# Patient Record
Sex: Female | Born: 1990 | Race: White | Hispanic: No | Marital: Single | Smoking: Never smoker
Health system: Southern US, Community
[De-identification: ages and names within clinical notes are randomized; demographics above are authoritative.]

## PROBLEM LIST (undated history)

## (undated) DIAGNOSIS — N200 Calculus of kidney: Secondary | ICD-10-CM

## (undated) DIAGNOSIS — J45909 Unspecified asthma, uncomplicated: Secondary | ICD-10-CM

## (undated) DIAGNOSIS — F422 Mixed obsessional thoughts and acts: Secondary | ICD-10-CM

## (undated) DIAGNOSIS — F329 Major depressive disorder, single episode, unspecified: Secondary | ICD-10-CM

## (undated) DIAGNOSIS — F411 Generalized anxiety disorder: Secondary | ICD-10-CM

## (undated) DIAGNOSIS — F429 Obsessive-compulsive disorder, unspecified: Secondary | ICD-10-CM

## (undated) HISTORY — PX: ARTHROSCOPIC REPAIR ACL: SUR80

---

## 2016-01-24 ENCOUNTER — Encounter: Payer: Self-pay | Admitting: *Deleted

## 2016-01-24 ENCOUNTER — Emergency Department
Admission: EM | Admit: 2016-01-24 | Discharge: 2016-01-24 | Disposition: A | Discharge status: Home / Self Care | Payer: BLUE CROSS/BLUE SHIELD | Source: Home / Self Care | Attending: Family Medicine | Admitting: Family Medicine

## 2016-01-24 DIAGNOSIS — B9789 Other viral agents as the cause of diseases classified elsewhere: Secondary | ICD-10-CM

## 2016-01-24 DIAGNOSIS — J069 Acute upper respiratory infection, unspecified: Secondary | ICD-10-CM | POA: Diagnosis not present

## 2016-01-24 HISTORY — DX: Unspecified asthma, uncomplicated: J45.909

## 2016-01-24 MED ORDER — AZITHROMYCIN 250 MG PO TABS: ORAL_TABLET | ORAL | 0 refills | Status: DC

## 2016-01-24 MED ORDER — PREDNISONE 20 MG PO TABS: ORAL_TABLET | ORAL | 0 refills | Status: DC

## 2016-01-24 NOTE — ED Provider Notes (Signed)
[facility]    CSN: [id number] Arrival date & time: 01/24/16  1551     History   Chief Complaint Chief Complaint  Patient presents with  . Facial Pain  . Otalgia  . Nasal Congestion    HPI Erika Hernandez is a 25 y.o. female.   Patient complains of sinus congestion, cough, and sore throat for about a month but has not felt ill.  During the past week she has developed increased sinus pressure and cough.  Last night she developed a mild sore throat, fatigue, and sensation of left ear being clogged. She had pneumonia in February this year.  She has a history of seasonal rhinitis and asthma.  She has a family history of asthma (brother).   The history is provided by the patient.    Past Medical History:  Diagnosis Date  . Asthma     There are no active problems to display for this patient.   Past Surgical History:  Procedure Laterality Date  . ARTHROSCOPIC REPAIR ACL      OB History    No data available       Home Medications    Prior to Admission medications   Medication Sig Start Date End Date Taking? Authorizing Provider  azithromycin (ZITHROMAX Z-PAK) 250 MG tablet Take 2 tabs today; then begin one tab once daily for 4 more days. (Rx void after 02/02/16) 01/24/16   Lattie HawStephen A Jung Ingerson, MD  predniSONE (DELTASONE) 20 MG tablet Take one tab by mouth twice daily for 5 days, then one daily. Take with food. 01/24/16   Lattie HawStephen A Donzella Carrol, MD    Family History History reviewed. No pertinent family history.  Social History Social History  Substance Use Topics  . Smoking status: Never Smoker  . Smokeless tobacco: Never Used  . Alcohol use Yes     Allergies   Patient has no known allergies.   Review of Systems Review of Systems  + sore throat + cough No pleuritic pain No wheezing + nasal congestion + post-nasal drainage + sinus pain/pressure No itchy/red eyes ? earache No hemoptysis No SOB No fever/chills No nausea No vomiting No  abdominal pain No diarrhea No urinary symptoms No skin rash + fatigue No myalgias No headache Used OTC meds without relief    Physical Exam Triage Vital Signs ED Triage Vitals  Enc Vitals Group     BP 01/24/16 1614 121/83     Pulse Rate 01/24/16 1614 90     Resp 01/24/16 1614 14     Temp 01/24/16 1614 98.3 F (36.8 C)     Temp Source 01/24/16 1614 Oral     SpO2 01/24/16 1614 99 %     Weight 01/24/16 1614 192 lb (87.1 kg)     Height --      Head Circumference --      Peak Flow --      Pain Score 01/24/16 1617 4     Pain Loc --      Pain Edu? --      Excl. in GC? --    No data found.   Updated Vital Signs BP 121/83 (BP Location: Left Arm)   Pulse 90   Temp 98.3 F (36.8 C) (Oral)   Resp 14   Wt 192 lb (87.1 kg)   LMP 01/07/2016   SpO2 99%   Visual Acuity Right Eye Distance:   Left Eye Distance:   Bilateral Distance:    Right Eye Near:  Left Eye Near:    Bilateral Near:     Physical Exam Nursing notes and Vital Signs reviewed. Appearance:  Patient appears stated age, and in no acute distress Eyes:  Pupils are equal, round, and reactive to light and accomodation.  Extraocular movement is intact.  Conjunctivae are not inflamed  Ears:  Canals normal.  Tympanic membranes normal.  Nose:  Mildly congested turbinates.  No sinus tenderness.  Pharynx:  Normal Neck:  Supple.  Tender enlarged posterior/lateral nodes are palpated bilaterally  Lungs:  Clear to auscultation.  Breath sounds are equal.  Moving air well. Heart:  Regular rate and rhythm without murmurs, rubs, or gallops.  Abdomen:  Nontender without masses or hepatosplenomegaly.  Bowel sounds are present.  No CVA or flank tenderness.  Extremities:  No edema.  Skin:  No rash present.    UC Treatments / Results  Labs (all labs ordered are listed, but only abnormal results are displayed) Labs Reviewed - No data to display  EKG  EKG Interpretation None       Radiology No results  found.  Procedures Procedures (including critical care time)  Medications Ordered in UC Medications - No data to display   Initial Impression / Assessment and Plan / UC Course  I have reviewed the triage vital signs and the nursing notes.  Pertinent labs & imaging results that were available during my care of the patient were reviewed by me and considered in my medical decision making (see chart for details).  Clinical Course   Reassurance:  there is no evidence of bacterial infection today.   Wither her history of asthma and seasonal rhinitis, begin prednisone burst/taper.  Take plain guaifenesin (1200mg  extended release tabs such as Mucinex) twice daily, with plenty of water, for cough and congestion.  May add Pseudoephedrine (30mg , one or two every 4 to 6 hours) for sinus congestion.  Get adequate rest.   May use Afrin nasal spray (or generic oxymetazoline) twice daily for about 5 days and then discontinue.  Also recommend using saline nasal spray several times daily and saline nasal irrigation (AYR is a common brand).  Use Flonase nasal spray each morning after using Afrin nasal spray and saline nasal irrigation. Try warm salt water gargles for sore throat.  Stop all antihistamines for now, and other non-prescription cough/cold preparations. May take Delsym Cough Suppressant at bedtime for nighttime cough.  Use albuterol inhaler as needed for shortness of breath or wheezing. Begin Azithromycin if not improving about one week or if persistent fever develops (Given a prescription to hold, with an expiration date)  Follow-up with family doctor if not improving about10 days.     Final Clinical Impressions(s) / UC Diagnoses   Final diagnoses:  Viral URI with cough    New Prescriptions New Prescriptions   AZITHROMYCIN (ZITHROMAX Z-PAK) 250 MG TABLET    Take 2 tabs today; then begin one tab once daily for 4 more days. (Rx void after 02/02/16)   PREDNISONE (DELTASONE) 20 MG TABLET     Take one tab by mouth twice daily for 5 days, then one daily. Take with food.     Lattie HawStephen A Ymani Porcher, MD 02/06/16 [phone]

## 2016-01-24 NOTE — ED Triage Notes (Signed)
Patient c/o 1 month of nasal congestion, cough and sore throat. Yesterday developed left facial pain and ear pain. Taken generic sinus med otc. Afebrile.

## 2016-01-24 NOTE — Discharge Instructions (Signed)
Take plain guaifenesin (1200mg  extended release tabs such as Mucinex) twice daily, with plenty of water, for cough and congestion.  May add Pseudoephedrine (30mg , one or two every 4 to 6 hours) for sinus congestion.  Get adequate rest.   May use Afrin nasal spray (or generic oxymetazoline) twice daily for about 5 days and then discontinue.  Also recommend using saline nasal spray several times daily and saline nasal irrigation (AYR is a common brand).  Use Flonase nasal spray each morning after using Afrin nasal spray and saline nasal irrigation. Try warm salt water gargles for sore throat.  Stop all antihistamines for now, and other non-prescription cough/cold preparations. May take Delsym Cough Suppressant at bedtime for nighttime cough.  Use albuterol inhaler as needed for shortness of breath or wheezing. Begin Azithromycin if not improving about one week or if persistent fever develops   Follow-up with family doctor if not improving about10 days.

## 2016-05-01 ENCOUNTER — Encounter: Payer: Self-pay | Admitting: *Deleted

## 2016-05-01 ENCOUNTER — Emergency Department
Admission: EM | Admit: 2016-05-01 | Discharge: 2016-05-01 | Disposition: A | Discharge status: Home / Self Care | Payer: BLUE CROSS/BLUE SHIELD | Source: Home / Self Care | Attending: Family Medicine | Admitting: Family Medicine

## 2016-05-01 ENCOUNTER — Emergency Department (INDEPENDENT_AMBULATORY_CARE_PROVIDER_SITE_OTHER): Payer: BLUE CROSS/BLUE SHIELD

## 2016-05-01 DIAGNOSIS — N132 Hydronephrosis with renal and ureteral calculous obstruction: Secondary | ICD-10-CM

## 2016-05-01 DIAGNOSIS — R109 Unspecified abdominal pain: Secondary | ICD-10-CM | POA: Diagnosis not present

## 2016-05-01 DIAGNOSIS — N200 Calculus of kidney: Secondary | ICD-10-CM

## 2016-05-01 LAB — POCT URINALYSIS DIP (MANUAL ENTRY)
Bilirubin, UA: NEGATIVE
Glucose, UA: NEGATIVE
Ketones, POC UA: NEGATIVE
Leukocytes, UA: NEGATIVE
Nitrite, UA: NEGATIVE
PROTEIN UA: NEGATIVE
SPEC GRAV UA: 1.025 (ref 1.030–1.035)
UROBILINOGEN UA: 0.2 (ref ?–2.0)
pH, UA: 5.5 (ref 5.0–8.0)

## 2016-05-01 LAB — POCT URINE PREGNANCY: PREG TEST UR: NEGATIVE

## 2016-05-01 MED ORDER — HYDROCODONE-ACETAMINOPHEN 5-325 MG PO TABS: 1.0000 | ORAL_TABLET | Freq: Four times a day (QID) | ORAL | 0 refills | Status: DC | PRN

## 2016-05-01 MED ORDER — ONDANSETRON 4 MG PO TBDP: ORAL_TABLET | ORAL | 0 refills | Status: DC

## 2016-05-01 MED ORDER — TAMSULOSIN HCL 0.4 MG PO CAPS: 0.4000 mg | ORAL_CAPSULE | Freq: Every day | ORAL | 0 refills | Status: DC

## 2016-05-01 MED ORDER — KETOROLAC TROMETHAMINE 60 MG/2ML IJ SOLN
60.0000 mg | Freq: Once | INTRAMUSCULAR | Status: AC
  Administered 2016-05-01: 60 mg via INTRAMUSCULAR

## 2016-05-01 MED ORDER — ONDANSETRON 4 MG PO TBDP
4.0000 mg | ORAL_TABLET | Freq: Once | ORAL | Status: AC
  Administered 2016-05-01: 4 mg via ORAL

## 2016-05-01 NOTE — ED Provider Notes (Signed)
[facility]    CSN: [id number] Arrival date & time: 05/01/16  [id number]     History   Chief Complaint Chief Complaint  Patient presents with  . Flank Pain    HPI Erika Hernandez is a 26 y.o. female.   Three days ago patient developed right lower back pain that lasted about 30 minutes.  She had nausea without vomiting.  The pain had resolved by the next day, but recurred last night at 7:30pm.  She has had sweats without fever.  The pain is now constant.  She has had mild dysuria and urgency. She has a family history of kidney stones in her paternal uncle and father.     Patient's last menstrual period was 04/15/2016.    The history is provided by the patient.  Flank Pain  This is a new problem. Episode onset: 3 days ago. The problem occurs constantly. The problem has been rapidly worsening. Pertinent negatives include no abdominal pain. Nothing aggravates the symptoms. Nothing relieves the symptoms. Treatments tried: Ibuprofen.    Past Medical History:  Diagnosis Date  . Asthma     There are no active problems to display for this patient.   Past Surgical History:  Procedure Laterality Date  . ARTHROSCOPIC REPAIR ACL      OB History    No data available       Home Medications    Prior to Admission medications   Medication Sig Start Date End Date Taking? Authorizing Provider  HYDROcodone-acetaminophen (NORCO/VICODIN) 5-325 MG tablet Take 1 tablet by mouth every 6 (six) hours as needed for moderate pain. 05/01/16   Lattie HawStephen A Beese, MD  ondansetron (ZOFRAN ODT) 4 MG disintegrating tablet Take one tab by mouth Q6hr prn nausea.  Dissolve under tongue. 05/01/16   Lattie HawStephen A Beese, MD  tamsulosin (FLOMAX) 0.4 MG CAPS capsule Take 1 capsule (0.4 mg total) by mouth daily after supper. 05/01/16   Lattie HawStephen A Beese, MD    Family History Family History  Problem Relation Age of Onset  . Kidney Stones Father     Social History Social History  Substance Use Topics    . Smoking status: Never Smoker  . Smokeless tobacco: Never Used  . Alcohol use Yes     Comment: 6-7 q wk     Allergies   Patient has no known allergies.   Review of Systems Review of Systems  Constitutional: Positive for appetite change and chills. Negative for fever.  Gastrointestinal: Positive for nausea. Negative for abdominal pain and vomiting.  Genitourinary: Positive for dysuria, flank pain, frequency and urgency. Negative for difficulty urinating.  All other systems reviewed and are negative.    Physical Exam Triage Vital Signs ED Triage Vitals  Enc Vitals Group     BP 05/01/16 0912 (!) 143/98     Pulse Rate 05/01/16 0912 67     Resp 05/01/16 0912 18     Temp 05/01/16 0912 97.6 F (36.4 C)     Temp Source 05/01/16 0912 Oral     SpO2 05/01/16 0912 99 %     Weight 05/01/16 0913 191 lb (86.6 kg)     Height 05/01/16 0913 5\' 8"  (1.727 m)     Head Circumference --      Peak Flow --      Pain Score 05/01/16 0923 10     Pain Loc --      Pain Edu? --      Excl. in GC? --  No data found.   Updated Vital Signs BP (!) 143/98 (BP Location: Left Arm)   Pulse 67   Temp 97.6 F (36.4 C) (Oral)   Resp 18   Ht 5\' 8"  (1.727 m)   Wt 191 lb (86.6 kg)   LMP 04/15/2016   SpO2 99%   BMI 29.04 kg/m   Visual Acuity Right Eye Distance:   Left Eye Distance:   Bilateral Distance:    Right Eye Near:   Left Eye Near:    Bilateral Near:     Physical Exam Nursing notes and Vital Signs reviewed. Appearance:  Patient appears stated age, and in no acute distress.    Eyes:  Pupils are equal, round, and reactive to light and accomodation.  Extraocular movement is intact.  Conjunctivae are not inflamed   Pharynx:  Normal; moist mucous membranes  Neck:  Supple.  No adenopathy Lungs:  Clear to auscultation.  Breath sounds are equal.  Moving air well. Heart:  Regular rate and rhythm without murmurs, rubs, or gallops.  Abdomen:  Nontender without masses or hepatosplenomegaly.   Bowel sounds are present.  Mild right flank tenderness.  Extremities:  No edema.  Skin:  No rash present.     UC Treatments / Results  Labs (all labs ordered are listed, but only abnormal results are displayed) Labs Reviewed  POCT URINALYSIS DIP (MANUAL ENTRY) - Abnormal; Notable for the following:       Result Value   Blood, UA moderate (*)    All other components within normal limits  POCT URINE PREGNANCY negative    EKG  EKG Interpretation None       Radiology Ct Renal Stone Study  Result Date: 05/01/2016 CLINICAL DATA:  Right flank pain for several days. Nausea and vomiting EXAM: CT ABDOMEN AND PELVIS WITHOUT CONTRAST TECHNIQUE: Multidetector CT imaging of the abdomen and pelvis was performed following the standard protocol without oral or intravenous contrast material administration. COMPARISON:  None. FINDINGS: Lower chest: Lung bases are clear. Hepatobiliary: There is a tiny apparent cyst in the dome of the liver on the right posteriorly. No other focal liver lesions are evident on this noncontrast enhanced study. Gallbladder wall is not appreciably thickened. There is no biliary duct dilatation. Pancreas: There is no pancreatic mass or inflammatory focus. Spleen: No splenic lesions are evident. Adrenals/Urinary Tract: Adrenals appear normal bilaterally. Right kidney appears mildly edematous. There is no renal mass on either side. There is moderately severe hydronephrosis on the right. There is no hydronephrosis on the left. There is no intrarenal calculus on either side. There is a 2 mm calculus in the distal right ureter near the ureterovesical junction. No other ureteral calculi are evident on either side. Urinary bladder is midline with wall thickness within normal limits. Stomach/Bowel: There is no appreciable bowel wall or mesenteric thickening. There is no bowel obstruction. No free air or portal venous air. Vascular/Lymphatic: There is no abdominal aortic aneurysm. No  vascular lesions are evident on this noncontrast enhanced study. There is no adenopathy in the abdomen or pelvis. Reproductive: Uterus is retroverted. No pelvic mass. There is minimal free fluid in the cul-de-sac. Other: Appendix region appears normal without inflammation. No abscess evident in the abdomen or pelvis. Musculoskeletal: There are no blastic or lytic bone lesions. There is no intramuscular or abdominal wall lesion. IMPRESSION: 2 mm calculus distal right ureter with moderately severe hydronephrosis on the right. Right kidney is mildly edematous. Minimal fluid in the cul-de-sac which may be  physiologic or may be reactive secondary to the distal ureteral calculus on the right. No bowel obstruction.  No abscess.  Appendix region appears normal. These results will be called to the ordering clinician or representative by the Radiologist Assistant, and communication documented in the PACS or zVision Dashboard. Electronically Signed   By: Bretta Bang III M.D.   On: 05/01/2016 10:36    Procedures Procedures (including critical care time)  Medications Ordered in UC Medications  ondansetron (ZOFRAN-ODT) disintegrating tablet 4 mg (4 mg Oral Given 05/01/16 0913)  ketorolac (TORADOL) injection 60 mg (60 mg Intramuscular Given 05/01/16 0957)     Initial Impression / Assessment and Plan / UC Course  I have reviewed the triage vital signs and the nursing notes.  Pertinent labs & imaging results that were available during my care of the patient were reviewed by me and considered in my medical decision making (see chart for details).    Administered Toradol 60mg  IM  Administered Zofran ODT 4mg  PO.  Given Rx for same. Begin Flomax. Rx for Lortab. Increase fluid intake.  May take Ibuprofen 200mg , 4 tabs every 8 hours with food.  If symptoms become significantly worse during the night or over the weekend, proceed to the local emergency room.  Followup with Family Doctor if not improved in two  days.    Final Clinical Impressions(s) / UC Diagnoses   Final diagnoses:  Flank pain  Kidney stone    New Prescriptions New Prescriptions   HYDROCODONE-ACETAMINOPHEN (NORCO/VICODIN) 5-325 MG TABLET    Take 1 tablet by mouth every 6 (six) hours as needed for moderate pain.   ONDANSETRON (ZOFRAN ODT) 4 MG DISINTEGRATING TABLET    Take one tab by mouth Q6hr prn nausea.  Dissolve under tongue.   TAMSULOSIN (FLOMAX) 0.4 MG CAPS CAPSULE    Take 1 capsule (0.4 mg total) by mouth daily after supper.     Lattie Haw, MD 05/10/16 [phone]

## 2016-05-01 NOTE — ED Triage Notes (Signed)
Pt c/o intermittent RT flank pain x 3 days, worse this morning. Last dose IBF 0630. Reports some dysuria and urinary urgency.

## 2016-05-01 NOTE — Discharge Instructions (Signed)
Increase fluid intake.  May take Ibuprofen 200mg, 4 tabs every 8 hours with food.  °If symptoms become significantly worse during the night or over the weekend, proceed to the local emergency room.  °

## 2016-10-19 ENCOUNTER — Ambulatory Visit (INDEPENDENT_AMBULATORY_CARE_PROVIDER_SITE_OTHER): Payer: Self-pay

## 2016-10-19 ENCOUNTER — Other Ambulatory Visit: Payer: Self-pay | Admitting: Emergency Medicine

## 2016-10-19 DIAGNOSIS — M25561 Pain in right knee: Secondary | ICD-10-CM

## 2016-10-19 DIAGNOSIS — S86911A Strain of unspecified muscle(s) and tendon(s) at lower leg level, right leg, initial encounter: Secondary | ICD-10-CM

## 2017-12-03 ENCOUNTER — Encounter: Payer: Self-pay | Admitting: Emergency Medicine

## 2017-12-03 DIAGNOSIS — F411 Generalized anxiety disorder: Secondary | ICD-10-CM | POA: Insufficient documentation

## 2017-12-03 DIAGNOSIS — F329 Major depressive disorder, single episode, unspecified: Secondary | ICD-10-CM

## 2017-12-03 DIAGNOSIS — F429 Obsessive-compulsive disorder, unspecified: Secondary | ICD-10-CM

## 2017-12-17 ENCOUNTER — Encounter: Payer: Self-pay | Admitting: Physician Assistant

## 2017-12-17 ENCOUNTER — Ambulatory Visit (INDEPENDENT_AMBULATORY_CARE_PROVIDER_SITE_OTHER): Payer: BLUE CROSS/BLUE SHIELD | Admitting: Physician Assistant

## 2017-12-17 DIAGNOSIS — F422 Mixed obsessional thoughts and acts: Secondary | ICD-10-CM

## 2017-12-17 DIAGNOSIS — F411 Generalized anxiety disorder: Secondary | ICD-10-CM | POA: Diagnosis not present

## 2017-12-17 DIAGNOSIS — F331 Major depressive disorder, recurrent, moderate: Secondary | ICD-10-CM

## 2017-12-17 DIAGNOSIS — Z79899 Other long term (current) drug therapy: Secondary | ICD-10-CM | POA: Diagnosis not present

## 2017-12-17 NOTE — Progress Notes (Signed)
[facility]  Patient ID: Erika Hernandez,  MRN: [id number]  PCP: Patient, No Pcp Per  Date of Evaluation: 12/17/2017 Time spent:15 minutes  Chief Complaint:  Chief Complaint    Follow-up      HISTORY/CURRENT STATUS: HPI For 3 month med check.  "I've gotten into periods of not wanting to take my meds.  I wish I didn't have to take them to feel normal. This weekend, I didn't take them, and I felt a decline pretty quickly.  I'm not as motivated as I was and feel a little more depressed. I'm kind of stubborn and proud and don't want to have to take it. But I know it's working."  States that as long as she is taking the medications she is motivated and has more energy.  She is able to enjoy things.  She is not isolating.  The OCD is controlled with Zoloft.  Anxiety is much better controlled as well.  She sleeps well.  Her father died in 2022-10-13, before I saw her the last time, and of course she is dealing with some grief.  Past medications for mental health diagnoses include: Trintellix, Zoloft, Rexulti, lithium, Xanax, Zyprexa, Lexapro, Abilify  Individual Medical History/ Review of Systems: Changes? :No   Allergies: Patient has no known allergies.  Current Medications:  Current Outpatient Medications:  .  Brexpiprazole (REXULTI) 2 MG TABS, Take 2 mg by mouth every morning., Disp: , Rfl:  .  liothyronine (CYTOMEL) 25 MCG tablet, Take by mouth daily., Disp: , Rfl:  .  lithium 600 MG capsule, Take 1,200 mg by mouth at bedtime. , Disp: , Rfl:  .  sertraline (ZOLOFT) 100 MG tablet, Take 100 mg by mouth. 2 1/2 qd, Disp: , Rfl:  .  HYDROcodone-acetaminophen (NORCO/VICODIN) 5-325 MG tablet, Take 1 tablet by mouth every 6 (six) hours as needed for moderate pain. (Patient not taking: Reported on 12/17/2017), Disp: 12 tablet, Rfl: 0 .  ondansetron (ZOFRAN ODT) 4 MG disintegrating tablet, Take one tab by mouth Q6hr prn nausea.  Dissolve under tongue. (Patient not taking: Reported  on 12/17/2017), Disp: 12 tablet, Rfl: 0 .  tamsulosin (FLOMAX) 0.4 MG CAPS capsule, Take 1 capsule (0.4 mg total) by mouth daily after supper. (Patient not taking: Reported on 12/17/2017), Disp: 10 capsule, Rfl: 0 Medication Side Effects: none  Family Medical/ Social History: Changes? No  MENTAL HEALTH EXAM:  There were no vitals taken for this visit.There is no height or weight on file to calculate BMI.  General Appearance: Casual  Eye Contact:  Good  Speech:  Clear and Coherent  Volume:  Normal  Mood:  Euthymic  Affect:  Appropriate  Thought Process:  Coherent  Orientation:  Full (Time, Place, and Person)  Thought Content: Logical   Suicidal Thoughts:  No  Homicidal Thoughts:  No  Memory:  WNL  Judgement:  Good  Insight:  Good  Psychomotor Activity:  Normal  Concentration:  Concentration: Good  Recall:  Good  Fund of Knowledge: Good  Language: Good  Assets:  Communication Skills Desire for Improvement  ADL's:  Intact  Cognition: WNL  Prognosis:  Good    DIAGNOSES:    ICD-10-CM   1. Mixed obsessional thoughts and acts F42.2   2. Major depressive disorder, recurrent episode, moderate (HCC) F33.1   3. Generalized anxiety disorder F41.1   4. Encounter for long-term (current) use of medications Z79.899 TSH    Lithium level    Comprehensive metabolic panel    CBC with  Differential/Platelet    Lipid panel    Hemoglobin A1c    Receiving Psychotherapy: Yes Meghan Denton   RECOMMENDATIONS: We discussed her medications.  For now, we agreed to leave everything the same, but if she is doing great, and has the desire to try to decrease and possibly wean 1 of the medications, we can do that at the next visit.  I do not want to change anything right now as we go into wintertime and the holidays.  It will be especially sad this year because the first year without her father during the holidays.  So we will see how she is doing in 3 months and at that time, consider decreasing  either Rexulti or lithium if appropriate. Continue psychotherapy with Mylinda Lat[facility] Return in 3 months.   Melony Overly, PA-C

## 2018-02-25 ENCOUNTER — Other Ambulatory Visit: Payer: Self-pay | Admitting: Psychiatry

## 2018-02-26 LAB — HEMOGLOBIN A1C
Est. average glucose Bld gHb Est-mCnc: 100 mg/dL
HEMOGLOBIN A1C: 5.1 % (ref 4.8–5.6)

## 2018-02-26 LAB — CBC WITH DIFFERENTIAL/PLATELET
BASOS: 0 %
Basophils Absolute: 0 10*3/uL (ref 0.0–0.2)
EOS (ABSOLUTE): 0.3 10*3/uL (ref 0.0–0.4)
EOS: 5 %
HEMOGLOBIN: 14.7 g/dL (ref 11.1–15.9)
Hematocrit: 43.2 % (ref 34.0–46.6)
IMMATURE GRANS (ABS): 0 10*3/uL (ref 0.0–0.1)
IMMATURE GRANULOCYTES: 0 %
Lymphocytes Absolute: 2 10*3/uL (ref 0.7–3.1)
Lymphs: 36 %
MCH: 30.4 pg (ref 26.6–33.0)
MCHC: 34 g/dL (ref 31.5–35.7)
MCV: 89 fL (ref 79–97)
MONOCYTES: 8 %
MONOS ABS: 0.4 10*3/uL (ref 0.1–0.9)
Neutrophils Absolute: 2.8 10*3/uL (ref 1.4–7.0)
Neutrophils: 51 %
Platelets: 219 10*3/uL (ref 150–450)
RBC: 4.84 x10E6/uL (ref 3.77–5.28)
RDW: 12.1 % (ref 11.7–15.4)
WBC: 5.5 10*3/uL (ref 3.4–10.8)

## 2018-02-26 LAB — LIPID PANEL
CHOL/HDL RATIO: 3.5 ratio (ref 0.0–4.4)
Cholesterol, Total: 227 mg/dL — ABNORMAL HIGH (ref 100–199)
HDL: 64 mg/dL (ref >39)
LDL CALC: 147 mg/dL — AB (ref 0–99)
TRIGLYCERIDES: 82 mg/dL (ref 0–149)
VLDL Cholesterol Cal: 16 mg/dL (ref 5–40)

## 2018-02-26 LAB — COMPREHENSIVE METABOLIC PANEL
A/G RATIO: 1.4 (ref 1.2–2.2)
ALBUMIN: 3.5 g/dL — AB (ref 3.9–5.0)
ALT: 14 IU/L (ref 0–32)
AST: 21 IU/L (ref 0–40)
Alkaline Phosphatase: 71 IU/L (ref 39–117)
BUN / CREAT RATIO: 13 (ref 9–23)
BUN: 10 mg/dL (ref 6–20)
Bilirubin Total: 0.3 mg/dL (ref 0.0–1.2)
CO2: 21 mmol/L (ref 20–29)
CREATININE: 0.77 mg/dL (ref 0.57–1.00)
Calcium: 9.1 mg/dL (ref 8.7–10.2)
Chloride: 107 mmol/L — ABNORMAL HIGH (ref 96–106)
GFR, EST AFRICAN AMERICAN: 121 mL/min/{1.73_m2} (ref >59)
GFR, EST NON AFRICAN AMERICAN: 105 mL/min/{1.73_m2} (ref >59)
GLOBULIN, TOTAL: 2.5 g/dL (ref 1.5–4.5)
Glucose: 87 mg/dL (ref 65–99)
POTASSIUM: 4.2 mmol/L (ref 3.5–5.2)
SODIUM: 140 mmol/L (ref 134–144)
TOTAL PROTEIN: 6 g/dL (ref 6.0–8.5)

## 2018-02-26 LAB — LITHIUM LEVEL: Lithium Lvl: 0.8 mmol/L (ref 0.6–1.2)

## 2018-02-26 LAB — TSH: TSH: 0.799 u[IU]/mL (ref 0.450–4.500)

## 2018-02-27 ENCOUNTER — Other Ambulatory Visit: Payer: Self-pay | Admitting: Physician Assistant

## 2018-03-01 ENCOUNTER — Other Ambulatory Visit: Payer: Self-pay

## 2018-03-01 ENCOUNTER — Emergency Department (INDEPENDENT_AMBULATORY_CARE_PROVIDER_SITE_OTHER): Payer: BLUE CROSS/BLUE SHIELD

## 2018-03-01 ENCOUNTER — Emergency Department
Admission: EM | Admit: 2018-03-01 | Discharge: 2018-03-01 | Disposition: A | Discharge status: Home / Self Care | Payer: BLUE CROSS/BLUE SHIELD | Source: Home / Self Care

## 2018-03-01 DIAGNOSIS — S6392XA Sprain of unspecified part of left wrist and hand, initial encounter: Secondary | ICD-10-CM

## 2018-03-01 DIAGNOSIS — M79642 Pain in left hand: Secondary | ICD-10-CM

## 2018-03-01 NOTE — ED Triage Notes (Signed)
Running yesterday, and tripped and fell on left hand/wrist.  Weak, painful below second knuckle.

## 2018-03-01 NOTE — ED Provider Notes (Signed)
[facility]    CSN: [id number] Arrival date & time: 03/01/18  1541     History   Chief Complaint No chief complaint on file.   HPI Erika Hernandez is a 28 y.o. female.   This is a 28 year old woman, established patient here at [facility] urgent care, comes in with left hand pain.  She fell yesterday on outstretched pain when running.  Tried ibuprofen and ice.     Past Medical History:  Diagnosis Date  . Asthma     Patient Active Problem List   Diagnosis Date Noted  . OCD (obsessive compulsive disorder) 12/03/2017  . MDD (major depressive disorder) 12/03/2017  . GAD (generalized anxiety disorder) 12/03/2017    Past Surgical History:  Procedure Laterality Date  . ARTHROSCOPIC REPAIR ACL      OB History   No obstetric history on file.      Home Medications    Prior to Admission medications   Medication Sig Start Date End Date Taking? Authorizing Provider  Brexpiprazole (REXULTI) 2 MG TABS Take 2 mg by mouth every morning. 09/24/17   [provider]  HYDROcodone-acetaminophen (NORCO/VICODIN) 5-325 MG tablet Take 1 tablet by mouth every 6 (six) hours as needed for moderate pain. Patient not taking: Reported on 12/17/2017 05/01/16   Lattie Haw, MD  liothyronine (CYTOMEL) 25 MCG tablet Take by mouth daily.    [provider]  lithium 600 MG capsule Take 1,200 mg by mouth at bedtime.  09/24/17   [provider]  ondansetron (ZOFRAN ODT) 4 MG disintegrating tablet Take one tab by mouth Q6hr prn nausea.  Dissolve under tongue. Patient not taking: Reported on 12/17/2017 05/01/16   Lattie Haw, MD  sertraline (ZOLOFT) 100 MG tablet TAKE 2 AND 1/2 TABLETS BY MOUTH EVERY DAY 02/27/18   Melony Overly T, PA-C  tamsulosin (FLOMAX) 0.4 MG CAPS capsule Take 1 capsule (0.4 mg total) by mouth daily after supper. Patient not taking: Reported on 12/17/2017 05/01/16   Lattie Haw, MD    Family History Family History  Problem  Relation Age of Onset  . Kidney Stones Father     Social History Social History   Tobacco Use  . Smoking status: Never Smoker  . Smokeless tobacco: Never Used  Substance Use Topics  . Alcohol use: Yes    Alcohol/week: 1.0 standard drinks    Types: 1 Cans of beer per week    Comment: 4-5 per week  . Drug use: No     Allergies   Patient has no known allergies.   Review of Systems Review of Systems   Physical Exam Triage Vital Signs ED Triage Vitals  Enc Vitals Group     BP      Pulse      Resp      Temp      Temp src      SpO2      Weight      Height      Head Circumference      Peak Flow      Pain Score      Pain Loc      Pain Edu?      Excl. in GC?    No data found.  Updated Vital Signs There were no vitals taken for this visit.   Physical Exam Vitals signs and nursing note reviewed.  Constitutional:      Appearance: Normal appearance. She is normal weight.  HENT:  Head: Normocephalic and atraumatic.     Nose: Nose normal.  Eyes:     Conjunctiva/sclera: Conjunctivae normal.  Neck:     Musculoskeletal: Normal range of motion and neck supple.  Pulmonary:     Effort: Pulmonary effort is normal.  Musculoskeletal: Normal range of motion.        General: Swelling, tenderness and signs of injury present.     Comments: Patient has mild swelling over the dorsal left hand just proximal to the middle metacarpal.  There is tenderness there as well.  Patient has full range of motion of her wrist, thumb.  There is no tenderness over the thumb or proximal thumb.  Skin:    General: Skin is warm and dry.  Neurological:     General: No focal deficit present.     Mental Status: She is alert and oriented to person, place, and time.  Psychiatric:        Mood and Affect: Mood normal.        Behavior: Behavior normal.      UC Treatments / Results  Labs (all labs ordered are listed, but only abnormal results are displayed) Labs Reviewed - No data to  display  EKG None  Radiology No results found.  Procedures Procedures (including critical care time)  Medications Ordered in UC Medications - No data to display  Initial Impression / Assessment and Plan / UC Course  I have reviewed the triage vital signs and the nursing notes.  Pertinent labs & imaging results that were available during my care of the patient were reviewed by me and considered in my medical decision making (see chart for details).    Final Clinical Impressions(s) / UC Diagnoses   Final diagnoses:  None   Discharge Instructions   None    ED Prescriptions    None     Controlled Substance Prescriptions [place] Controlled Substance Registry consulted? Not Applicable   Elvina Sidle, MD 03/01/18 1736

## 2018-03-19 ENCOUNTER — Ambulatory Visit: Payer: BLUE CROSS/BLUE SHIELD | Admitting: Physician Assistant

## 2018-03-19 ENCOUNTER — Encounter: Payer: Self-pay | Admitting: Physician Assistant

## 2018-03-19 DIAGNOSIS — Z79899 Other long term (current) drug therapy: Secondary | ICD-10-CM

## 2018-03-19 DIAGNOSIS — F422 Mixed obsessional thoughts and acts: Secondary | ICD-10-CM

## 2018-03-19 DIAGNOSIS — F320 Major depressive disorder, single episode, mild: Secondary | ICD-10-CM

## 2018-03-19 DIAGNOSIS — F411 Generalized anxiety disorder: Secondary | ICD-10-CM | POA: Diagnosis not present

## 2018-03-19 MED ORDER — LITHIUM CARBONATE 300 MG PO CAPS: ORAL_CAPSULE | ORAL | 0 refills | Status: DC

## 2018-03-19 MED ORDER — SERTRALINE HCL 100 MG PO TABS: 300.0000 mg | ORAL_TABLET | Freq: Every day | ORAL | 1 refills | Status: AC

## 2018-03-19 MED ORDER — BREXPIPRAZOLE 2 MG PO TABS
2.0000 mg | ORAL_TABLET | ORAL | 5 refills | Status: AC
Start: 2018-03-19 — End: ?

## 2018-03-19 MED ORDER — LIOTHYRONINE SODIUM 5 MCG PO TABS: ORAL_TABLET | ORAL | 0 refills | Status: DC

## 2018-03-19 NOTE — Progress Notes (Signed)
[facility]  Patient ID: Donalee Moreland,  MRN: [id number]  PCP: Patient, No Pcp Per  Date of Evaluation: 03/19/2018 Time spent:15 minutes   Chief Complaint:   HISTORY/CURRENT STATUS: HPI Here for 3 month med check.   Doing well overall.  She and her family made it through the holidays, the first 1 without her dad.  And then yesterday was his birthday.  He would have been 60.  All of their family gathered to have a celebration in his memory.  She states is been hard but she has made it through it and her faith is what gets her through.  The OCD is not as well-controlled as she would like.  She has obsessive thoughts that are difficult to get rid of.  She is discussed it with her counselor she is trying to work through things.  We have discussed increasing the Zoloft in the past and she is receptive to that.  She is not depressed but does state that she has to make herself get out and do things sometimes.  She is an introvert and therefore she re energizes by being alone but she realizes it can sometimes go too far and she uses it as an escape.  She is pretty good at not allowing that to happen.  Her energy and motivation are just okay.  Since she has been on the lithium, she has felt sluggish and not really clear headed.  She denies any suicidal thoughts.  She would still like to get off of the lithium just to see if she can do without it.   She is not missing any days at work.  She is a Editor, commissioning.  Sleeps well most of the time.  Denies muscle or joint pain, stiffness, or dystonia.  Denies dizziness, syncope, seizures, numbness, tingling, tremor, tics, unsteady gait, slurred speech, confusion.   Individual Medical History/ Review of Systems: Changes? :No    Past medications for mental health diagnoses include: Trintellix, Zoloft, Rexulti, lithium, Xanax, Zyprexa, Lexapro, Abilify  Allergies: Patient has no known allergies.  Current Medications:  Current Outpatient  Medications:  .  Brexpiprazole (REXULTI) 2 MG TABS, Take 2 mg by mouth every morning., Disp: 30 tablet, Rfl: 5 .  lithium 600 MG capsule, Take 1,200 mg by mouth at bedtime. , Disp: , Rfl:  .  sertraline (ZOLOFT) 100 MG tablet, Take 3 tablets (300 mg total) by mouth daily., Disp: 270 tablet, Rfl: 1 .  liothyronine (CYTOMEL) 5 MCG tablet, 2 po qd for 1 week, then 1 qd for 1 week, then stop, Disp: 21 tablet, Rfl: 0 .  lithium carbonate 300 MG capsule, Take as directed to wean off Lithium (total 900mg /d for a wk, 600mg /d for a wk, then 300mg /d for a week, then stop), Disp: 14 capsule, Rfl: 0 Medication Side Effects: none  Family Medical/ Social History: Changes? Yes has started dating someone new.  MENTAL HEALTH EXAM:  There were no vitals taken for this visit.There is no height or weight on file to calculate BMI.  General Appearance: Casual and Well Groomed  Eye Contact:  Good  Speech:  Clear and Coherent  Volume:  Normal  Mood:  Euthymic  Affect:  Appropriate  Thought Process:  Goal Directed  Orientation:  Full (Time, Place, and Person)  Thought Content: Logical   Suicidal Thoughts:  No  Homicidal Thoughts:  No  Memory:  WNL  Judgement:  Good  Insight:  Good  Psychomotor Activity:  Normal  Concentration:  Concentration: Good  Recall:  Good  Fund of Knowledge: Good  Language: Good  Assets:  Desire for Improvement  ADL's:  Intact  Cognition: WNL  Prognosis:  Good    DIAGNOSES:    ICD-10-CM   1. Mixed obsessional thoughts and acts F42.2   2. Encounter for long-term (current) use of medications Z79.899 Thyroid Panel With TSH  3. Current mild episode of major depressive disorder, unspecified whether recurrent (HCC) F32.0   4. GAD (generalized anxiety disorder) F41.1     Receiving Psychotherapy: Yes    RECOMMENDATIONS: Increase Zoloft to 300 mg daily. Wean off lithium by going to 900 mg nightly for 1 week, 600 mg nightly for 1 week and then 300 mg nightly for 1 week and then  stop.  She knows to call if she has any withdrawals or increasing depression while coming off of the medication.  She has 600 mg pills and I am sending in a prescription for 300 mg pills.  She verbalizes understanding on the dosages to take as above. Wean off Cytomel 5 mcg 2 p.o. daily for 1 week, then 1 p.o. daily for 1 week and then stop. Continue Rexulti 2 mg daily. Continue psychotherapy. Get thyroid profile with TSH in approximately 3 to 4 weeks from now. Return in 4 to 6 weeks or sooner as needed.   Melony Overly, PA-C

## 2018-04-25 ENCOUNTER — Ambulatory Visit: Payer: BLUE CROSS/BLUE SHIELD | Admitting: Physician Assistant

## 2018-05-06 ENCOUNTER — Other Ambulatory Visit: Payer: Self-pay

## 2018-05-06 ENCOUNTER — Emergency Department (HOSPITAL_BASED_OUTPATIENT_CLINIC_OR_DEPARTMENT_OTHER): Payer: BLUE CROSS/BLUE SHIELD

## 2018-05-06 ENCOUNTER — Encounter (HOSPITAL_BASED_OUTPATIENT_CLINIC_OR_DEPARTMENT_OTHER): Payer: Self-pay | Admitting: Emergency Medicine

## 2018-05-06 ENCOUNTER — Inpatient Hospital Stay (HOSPITAL_BASED_OUTPATIENT_CLINIC_OR_DEPARTMENT_OTHER)
Admission: EM | Admit: 2018-05-06 | Discharge: 2018-06-07 | DRG: 023 | Disposition: E | Payer: BLUE CROSS/BLUE SHIELD | Attending: Neurology | Admitting: Neurology

## 2018-05-06 DIAGNOSIS — Z978 Presence of other specified devices: Secondary | ICD-10-CM

## 2018-05-06 DIAGNOSIS — I6322 Cerebral infarction due to unspecified occlusion or stenosis of basilar arteries: Principal | ICD-10-CM | POA: Diagnosis present

## 2018-05-06 DIAGNOSIS — G8250 Quadriplegia, unspecified: Secondary | ICD-10-CM | POA: Diagnosis not present

## 2018-05-06 DIAGNOSIS — I639 Cerebral infarction, unspecified: Secondary | ICD-10-CM | POA: Diagnosis present

## 2018-05-06 DIAGNOSIS — G835 Locked-in state: Secondary | ICD-10-CM | POA: Diagnosis not present

## 2018-05-06 DIAGNOSIS — Z79899 Other long term (current) drug therapy: Secondary | ICD-10-CM | POA: Diagnosis not present

## 2018-05-06 DIAGNOSIS — J45909 Unspecified asthma, uncomplicated: Secondary | ICD-10-CM | POA: Diagnosis not present

## 2018-05-06 DIAGNOSIS — F329 Major depressive disorder, single episode, unspecified: Secondary | ICD-10-CM | POA: Diagnosis present

## 2018-05-06 DIAGNOSIS — G8194 Hemiplegia, unspecified affecting left nondominant side: Secondary | ICD-10-CM | POA: Diagnosis present

## 2018-05-06 DIAGNOSIS — J9600 Acute respiratory failure, unspecified whether with hypoxia or hypercapnia: Secondary | ICD-10-CM | POA: Diagnosis not present

## 2018-05-06 DIAGNOSIS — Z66 Do not resuscitate: Secondary | ICD-10-CM | POA: Diagnosis not present

## 2018-05-06 DIAGNOSIS — F422 Mixed obsessional thoughts and acts: Secondary | ICD-10-CM | POA: Diagnosis not present

## 2018-05-06 DIAGNOSIS — Z515 Encounter for palliative care: Secondary | ICD-10-CM | POA: Diagnosis present

## 2018-05-06 DIAGNOSIS — R29810 Facial weakness: Secondary | ICD-10-CM | POA: Diagnosis present

## 2018-05-06 DIAGNOSIS — I7774 Dissection of vertebral artery: Secondary | ICD-10-CM | POA: Diagnosis present

## 2018-05-06 DIAGNOSIS — E785 Hyperlipidemia, unspecified: Secondary | ICD-10-CM | POA: Diagnosis not present

## 2018-05-06 DIAGNOSIS — F411 Generalized anxiety disorder: Secondary | ICD-10-CM | POA: Diagnosis not present

## 2018-05-06 DIAGNOSIS — R29707 NIHSS score 7: Secondary | ICD-10-CM | POA: Diagnosis not present

## 2018-05-06 DIAGNOSIS — R1310 Dysphagia, unspecified: Secondary | ICD-10-CM | POA: Diagnosis not present

## 2018-05-06 DIAGNOSIS — Z7189 Other specified counseling: Secondary | ICD-10-CM

## 2018-05-06 DIAGNOSIS — R0602 Shortness of breath: Secondary | ICD-10-CM

## 2018-05-06 DIAGNOSIS — Z4659 Encounter for fitting and adjustment of other gastrointestinal appliance and device: Secondary | ICD-10-CM

## 2018-05-06 DIAGNOSIS — R200 Anesthesia of skin: Secondary | ICD-10-CM

## 2018-05-06 DIAGNOSIS — R4781 Slurred speech: Secondary | ICD-10-CM | POA: Diagnosis not present

## 2018-05-06 DIAGNOSIS — I6350 Cerebral infarction due to unspecified occlusion or stenosis of unspecified cerebral artery: Secondary | ICD-10-CM

## 2018-05-06 DIAGNOSIS — I10 Essential (primary) hypertension: Secondary | ICD-10-CM | POA: Diagnosis present

## 2018-05-06 DIAGNOSIS — K117 Disturbances of salivary secretion: Secondary | ICD-10-CM

## 2018-05-06 HISTORY — DX: Mixed obsessional thoughts and acts: F42.2

## 2018-05-06 HISTORY — DX: Generalized anxiety disorder: F41.1

## 2018-05-06 HISTORY — DX: Calculus of kidney: N20.0

## 2018-05-06 HISTORY — DX: Obsessive-compulsive disorder, unspecified: F42.9

## 2018-05-06 HISTORY — DX: Major depressive disorder, single episode, unspecified: F32.9

## 2018-05-06 LAB — CBC WITH DIFFERENTIAL/PLATELET
Abs Immature Granulocytes: 0.01 10*3/uL (ref 0.00–0.07)
Basophils Absolute: 0 10*3/uL (ref 0.0–0.1)
Basophils Relative: 0 %
Eosinophils Absolute: 0.3 10*3/uL (ref 0.0–0.5)
Eosinophils Relative: 3 %
HCT: 45.2 % (ref 36.0–46.0)
Hemoglobin: 14.5 g/dL (ref 12.0–15.0)
IMMATURE GRANULOCYTES: 0 %
Lymphocytes Relative: 24 %
Lymphs Abs: 2.2 10*3/uL (ref 0.7–4.0)
MCH: 29.9 pg (ref 26.0–34.0)
MCHC: 32.1 g/dL (ref 30.0–36.0)
MCV: 93.2 fL (ref 80.0–100.0)
MONOS PCT: 9 %
Monocytes Absolute: 0.8 10*3/uL (ref 0.1–1.0)
NEUTROS PCT: 64 %
NRBC: 0 % (ref 0.0–0.2)
Neutro Abs: 5.9 10*3/uL (ref 1.7–7.7)
PLATELETS: 231 10*3/uL (ref 150–400)
RBC: 4.85 MIL/uL (ref 3.87–5.11)
RDW: 12.3 % (ref 11.5–15.5)
WBC: 9.2 10*3/uL (ref 4.0–10.5)

## 2018-05-06 LAB — COMPREHENSIVE METABOLIC PANEL
ALT: 16 U/L (ref 0–44)
AST: 22 U/L (ref 15–41)
Albumin: 3.2 g/dL — ABNORMAL LOW (ref 3.5–5.0)
Alkaline Phosphatase: 80 U/L (ref 38–126)
Anion gap: 7 (ref 5–15)
BUN: 12 mg/dL (ref 6–20)
CO2: 22 mmol/L (ref 22–32)
Calcium: 8.9 mg/dL (ref 8.9–10.3)
Chloride: 107 mmol/L (ref 98–111)
Creatinine, Ser: 0.65 mg/dL (ref 0.44–1.00)
GFR calc non Af Amer: >60 mL/min (ref >60)
Glucose, Bld: 110 mg/dL — ABNORMAL HIGH (ref 70–99)
Potassium: 3.7 mmol/L (ref 3.5–5.1)
Sodium: 136 mmol/L (ref 135–145)
Total Bilirubin: 0.5 mg/dL (ref 0.3–1.2)
Total Protein: 6.6 g/dL (ref 6.5–8.1)

## 2018-05-06 LAB — CBG MONITORING, ED: GLUCOSE-CAPILLARY: 97 mg/dL (ref 70–99)

## 2018-05-06 LAB — I-STAT BETA HCG BLOOD, ED (MC, WL, AP ONLY): I-stat hCG, quantitative: <5 m[IU]/mL (ref <5)

## 2018-05-06 NOTE — ED Triage Notes (Signed)
Patient here with c/o left leg numbness and weakness after walking at home around 1430 today, her mom then called her and stated her speech was slurred. HA 4/10. Patient also mentioned she had left neck pain for the past 3 days. Patient aox4.

## 2018-05-06 NOTE — ED Provider Notes (Addendum)
11:20 PM  Pt is a 27 y.o. female with no significant past medical history who presents to the emergency department as a transfer from med [employer].  Patient was complaining of left-sided neck pain and is having some mild headache here today.  Developed left-sided numbness, weakness, facial droop and slurred speech around 4:30 pm today.  Slurred speech has resolved.  No history of MS, seizure-like activity, brain tumor, stroke.  Neurology was consulted and recommended MRI with and without contrast of the brain and cervical spine given CT head shows patchy areas of hypodensity in the cerebellar hemispheres consistent with possible infarct.  Patient c/o mild HA and neck pain but declines medication for headache at this time.  Labs unremarkable.   On my exam, patient has left-sided facial, arm and leg numbness compared to the right.  No significant weakness noted.  Mild left facial droop.  Cranial nerves II through XII otherwise intact.  Normal speech.  MRIs ordered immediately upon patient arrival.  3:20 AM  D/w Dr. Karie Kirks with radiology.  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  She recommends CTA of the head and neck.  Will discuss with neurology as well.  Updated patient.  She is upset but has no new complaints at this time.  3:30 AM  Spoke to Dr. Amada Jupiter with neurology who recommends medical admission and will see patient in consult.  Appreciate his help.  I have also updated patient's mother Fardowsa Sandbothe by phone per patient's request.  Patient does not currently have a primary care provider.    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  Dr. Amada Jupiter will admit to ICU.  Updated hospitalist.  4:10 AM  Dr. Amada Jupiter is examining the patient.  Patient now has a left gaze preference which is new compared to my exam.  Normal visual fields.  Normal strength bilaterally.   Still has loss of sensation in the left face, arm and leg.  Also has new speech changes with mild slurred speech.  These new findings just started after coming back from CT.  NIHSS 6.  Patient is awake, alert.  Dr. Amada Jupiter will admit to the ICU has talking to patient and patient's mother regarding treatment plan.  Given patient critically ill, neurologist has talked to charge nurse to allow patient's mother to see patient here in the ED.  Neurology does not feel that patient is at risk for herniation now.  4:20 AM  Neurology discussing case with IR given worsening symptoms in the past 30-60 minutes.  4:30 AM  Plan at this time is to start heparin and admit to ICU.   I reviewed all nursing notes, vitals, pertinent previous records, EKGs, lab and urine results, imaging (as available).    CRITICAL CARE Performed by: Rochele Raring   Total critical care time: 65 minutes  Critical care time was exclusive of separately billable procedures and treating other patients.  Critical care was necessary to treat or prevent imminent or life-threatening deterioration.  Critical care was time spent personally by me on the following activities: development of treatment plan with patient and/or surrogate as well as nursing, discussions with consultants, evaluation of patient's response to treatment, examination of patient, obtaining history from patient or surrogate, ordering and performing treatments and interventions, ordering and review of laboratory studies, ordering and review of radiographic studies, pulse oximetry and re-evaluation of patient's condition.  Ward, Layla Maw, DO May 26, 2018 1007     Addendum: Patient presented with RIGHT sided facial droop.   Ward, Layla Maw, DO 05/08/18 [phone]

## 2018-05-06 NOTE — ED Provider Notes (Signed)
[facility] EMERGENCY DEPARTMENT Provider Note   CSN: [id number] Arrival date & time: 05/10/18  2026    History   Chief Complaint Chief Complaint  Patient presents with  . Numbness    HPI Erika Hernandez is a 28 y.o. female.     HPI Patient states she is had several days of left-sided neck pain.  Began having left arm numbness which started this afternoon around 4:30 PM.  States that she took a nap and when she woke she was having difficulty ambulating.  Her mother noted that she had slurred speech which patient states has since resolved.  Denies any recent trauma.  No fever or chills. Past Medical History:  Diagnosis Date  . Asthma   . GAD (generalized anxiety disorder)   . Kidney stone   . Major depressive disorder   . Mixed obsessional thoughts and acts   . OCD (obsessive compulsive disorder)     Patient Active Problem List   Diagnosis Date Noted  . OCD (obsessive compulsive disorder) 12/03/2017  . MDD (major depressive disorder) 12/03/2017  . GAD (generalized anxiety disorder) 12/03/2017    Past Surgical History:  Procedure Laterality Date  . ARTHROSCOPIC REPAIR ACL       OB History   No obstetric history on file.      Home Medications    Prior to Admission medications   Medication Sig Start Date End Date Taking? Authorizing Provider  Brexpiprazole (REXULTI) 2 MG TABS Take 2 mg by mouth every morning. 03/19/18   Melony Overly T, PA-C  sertraline (ZOLOFT) 100 MG tablet Take 3 tablets (300 mg total) by mouth daily. 03/19/18   Cherie Ouch, PA-C    Family History Family History  Problem Relation Age of Onset  . Kidney Stones Father     Social History Social History   Tobacco Use  . Smoking status: Never Smoker  . Smokeless tobacco: Never Used  Substance Use Topics  . Alcohol use: Yes    Alcohol/week: 1.0 standard drinks    Types: 1 Cans of beer per week    Comment: 1-2 per week  . Drug use: No     Allergies   Patient has no  known allergies.   Review of Systems Review of Systems  Constitutional: Negative for chills and fever.  HENT: Negative for congestion, sinus pressure, sore throat and trouble swallowing.   Eyes: Negative for visual disturbance.  Respiratory: Negative for shortness of breath.   Cardiovascular: Negative for chest pain, palpitations and leg swelling.  Gastrointestinal: Negative for abdominal pain, nausea and vomiting.  Musculoskeletal: Positive for gait problem, myalgias and neck pain. Negative for back pain and neck stiffness.  Skin: Negative for rash and wound.  Neurological: Positive for speech difficulty, weakness, numbness and headaches. Negative for dizziness and light-headedness.  All other systems reviewed and are negative.    Physical Exam Updated Vital Signs BP 129/88   Pulse 65   Temp 97.9 F (36.6 C) (Oral)   Resp 18   Ht  (1.727 m)   Wt 83.9 kg   LMP 05/01/2018 (Exact Date)   SpO2 99%   BMI 28.13 kg/m   Physical Exam Vitals signs and nursing note reviewed.  Constitutional:      Appearance: Normal appearance. She is well-developed.  HENT:     Head: Normocephalic and atraumatic.     Nose: Nose normal.     Mouth/Throat:     Mouth: Mucous membranes are moist.  Eyes:  Extraocular Movements: Extraocular movements intact.     Pupils: Pupils are equal, round, and reactive to light.  Neck:     Musculoskeletal: Normal range of motion and neck supple. Muscular tenderness present. No neck rigidity.     Comments: Patient with left paracervical and trapezius tenderness to palpation.  No meningismus. Cardiovascular:     Rate and Rhythm: Normal rate and regular rhythm.     Heart sounds: No murmur. No friction rub. No gallop.   Pulmonary:     Effort: Pulmonary effort is normal.     Breath sounds: Normal breath sounds.  Abdominal:     General: Bowel sounds are normal.     Palpations: Abdomen is soft.     Tenderness: There is no abdominal tenderness. There is no  guarding or rebound.  Musculoskeletal: Normal range of motion.        General: No swelling, tenderness, deformity or signs of injury.     Right lower leg: No edema.     Left lower leg: No edema.  Lymphadenopathy:     Cervical: No cervical adenopathy.  Skin:    General: Skin is warm and dry.     Findings: No erythema or rash.  Neurological:     Mental Status: She is alert and oriented to person, place, and time.     Comments: Patient with sensory changes in the left upper and lower face compared to the right and left upper and lower extremity compared to the right.  5/5 motor in bilateral upper and lower extremities.  No facial asymmetry.  Voice is clear.  Psychiatric:        Behavior: Behavior normal.      ED Treatments / Results  Labs (all labs ordered are listed, but only abnormal results are displayed) Labs Reviewed  COMPREHENSIVE METABOLIC PANEL - Abnormal; Notable for the following components:      Result Value   Glucose, Bld 110 (*)    Albumin 3.2 (*)    All other components within normal limits  CBC WITH DIFFERENTIAL/PLATELET  PREGNANCY, URINE  CBG MONITORING, ED    EKG EKG Interpretation  Date/Time:  Monday May 06 2018 20:39:16 EDT Ventricular Rate:  62 PR Interval:    QRS Duration: 85 QT Interval:  423 QTC Calculation: 430 R Axis:   87 Text Interpretation:  Sinus arrhythmia Probable left atrial enlargement Low voltage, precordial leads Baseline wander in lead(s) V5 Confirmed by Loren Racer ([id number]) on 04/18/2018 9:10:31 PM   Radiology Ct Head Wo Contrast  Result Date: 04/30/2018 CLINICAL DATA:  28 year old female with left leg numbness and weakness. EXAM: CT HEAD WITHOUT CONTRAST TECHNIQUE: Contiguous axial images were obtained from the base of the skull through the vertex without intravenous contrast. COMPARISON:  None. FINDINGS: Brain: The ventricles and sulci appropriate size for patient's age. Patchy areas of hypodensity in the cerebellar hemispheres  bilaterally, left greater right, most consistent with areas of infarct of indeterminate age. Acute infarct is not excluded. Clinical correlation is recommended. If there is clinical concern for acute infarct further evaluation with MRI is recommended. A somewhat linear area hypodensity in the right cerebellar hemisphere likely represents an old infarct. There is no acute intracranial hemorrhage. No mass effect or midline shift. No extra-axial fluid collection. Vascular: No hyperdense vessel or unexpected calcification. Skull: Normal. Negative for fracture or focal lesion. Sinuses/Orbits: No acute finding. Other: None IMPRESSION: 1. No acute intracranial hemorrhage. 2. Patchy areas of hypodensity in the cerebellar hemispheres bilaterally, left greater right,  most consistent with areas of infarct of indeterminate age. Acute infarct is not excluded. Clinical correlation is recommended. If there is clinical concern for acute infarct further evaluation with MRI recommended. These results were called by telephone at the time of interpretation on 05/05/2018 at 9:25 pm to Dr. Loren Racer , who verbally acknowledged these results. Electronically Signed   By: Elgie Collard M.D.   On: 04/16/2018 21:31    Procedures Procedures (including critical care time)  Medications Ordered in ED Medications - No data to display   Initial Impression / Assessment and Plan / ED Course  I have reviewed the triage vital signs and the nursing notes.  Pertinent labs & imaging results that were available during my care of the patient were reviewed by me and considered in my medical decision making (see chart for details).       Discussed CT findings with Dr. Amada Jupiter who reviewed images.  Thinks this is likely streek artifact but needs MRI further evaluate.  Discussed with Dr. Dalene Seltzer who will accept patient transfer to the [facility] emergency department for MRI with and without contrast of the brain and cervical  spine.  Patient has been informed.  Final Clinical Impressions(s) / ED Diagnoses   Final diagnoses:  Left sided numbness    ED Discharge Orders    None       Loren Racer, MD 04/23/2018 2144

## 2018-05-06 NOTE — ED Notes (Signed)
Pt returned from CT °

## 2018-05-06 NOTE — ED Notes (Signed)
Called [facility] and requested transport to [facility] ED - Dx Numbless of left side.  S/W Pam.  Pts needs monitor.  Advised no exposure or suspicion of Covid 19.  Accepting physician Dr. Dalene Seltzer

## 2018-05-06 NOTE — ED Notes (Signed)
Report given to Woody, RN  

## 2018-05-07 ENCOUNTER — Inpatient Hospital Stay (HOSPITAL_COMMUNITY): Payer: BLUE CROSS/BLUE SHIELD

## 2018-05-07 ENCOUNTER — Encounter (HOSPITAL_COMMUNITY): Admission: EM | Disposition: E | Payer: Self-pay | Source: Home / Self Care | Attending: Neurology

## 2018-05-07 ENCOUNTER — Inpatient Hospital Stay (HOSPITAL_COMMUNITY): Payer: BLUE CROSS/BLUE SHIELD | Admitting: Certified Registered Nurse Anesthetist

## 2018-05-07 ENCOUNTER — Ambulatory Visit (INDEPENDENT_AMBULATORY_CARE_PROVIDER_SITE_OTHER): Payer: BLUE CROSS/BLUE SHIELD | Admitting: Physician Assistant

## 2018-05-07 ENCOUNTER — Encounter (HOSPITAL_COMMUNITY): Payer: Self-pay | Admitting: *Deleted

## 2018-05-07 ENCOUNTER — Emergency Department (HOSPITAL_COMMUNITY): Payer: BLUE CROSS/BLUE SHIELD

## 2018-05-07 DIAGNOSIS — Z515 Encounter for palliative care: Secondary | ICD-10-CM | POA: Diagnosis not present

## 2018-05-07 DIAGNOSIS — Z79899 Other long term (current) drug therapy: Secondary | ICD-10-CM | POA: Diagnosis not present

## 2018-05-07 DIAGNOSIS — G8194 Hemiplegia, unspecified affecting left nondominant side: Secondary | ICD-10-CM | POA: Diagnosis present

## 2018-05-07 DIAGNOSIS — Z7189 Other specified counseling: Secondary | ICD-10-CM | POA: Diagnosis not present

## 2018-05-07 DIAGNOSIS — F411 Generalized anxiety disorder: Secondary | ICD-10-CM | POA: Diagnosis present

## 2018-05-07 DIAGNOSIS — I639 Cerebral infarction, unspecified: Secondary | ICD-10-CM | POA: Diagnosis present

## 2018-05-07 DIAGNOSIS — R4781 Slurred speech: Secondary | ICD-10-CM | POA: Diagnosis present

## 2018-05-07 DIAGNOSIS — E785 Hyperlipidemia, unspecified: Secondary | ICD-10-CM | POA: Diagnosis present

## 2018-05-07 DIAGNOSIS — R29810 Facial weakness: Secondary | ICD-10-CM | POA: Diagnosis present

## 2018-05-07 DIAGNOSIS — R200 Anesthesia of skin: Secondary | ICD-10-CM

## 2018-05-07 DIAGNOSIS — J9601 Acute respiratory failure with hypoxia: Secondary | ICD-10-CM | POA: Diagnosis not present

## 2018-05-07 DIAGNOSIS — J45909 Unspecified asthma, uncomplicated: Secondary | ICD-10-CM | POA: Diagnosis present

## 2018-05-07 DIAGNOSIS — F422 Mixed obsessional thoughts and acts: Secondary | ICD-10-CM

## 2018-05-07 DIAGNOSIS — Z66 Do not resuscitate: Secondary | ICD-10-CM | POA: Diagnosis present

## 2018-05-07 DIAGNOSIS — I6302 Cerebral infarction due to thrombosis of basilar artery: Secondary | ICD-10-CM

## 2018-05-07 DIAGNOSIS — G835 Locked-in state: Secondary | ICD-10-CM | POA: Diagnosis not present

## 2018-05-07 DIAGNOSIS — F329 Major depressive disorder, single episode, unspecified: Secondary | ICD-10-CM | POA: Diagnosis present

## 2018-05-07 DIAGNOSIS — R29707 NIHSS score 7: Secondary | ICD-10-CM | POA: Diagnosis present

## 2018-05-07 DIAGNOSIS — J9600 Acute respiratory failure, unspecified whether with hypoxia or hypercapnia: Secondary | ICD-10-CM | POA: Diagnosis not present

## 2018-05-07 DIAGNOSIS — R0602 Shortness of breath: Secondary | ICD-10-CM | POA: Diagnosis not present

## 2018-05-07 DIAGNOSIS — I7774 Dissection of vertebral artery: Secondary | ICD-10-CM | POA: Diagnosis present

## 2018-05-07 DIAGNOSIS — I6300 Cerebral infarction due to thrombosis of unspecified precerebral artery: Secondary | ICD-10-CM

## 2018-05-07 DIAGNOSIS — G8250 Quadriplegia, unspecified: Secondary | ICD-10-CM | POA: Diagnosis not present

## 2018-05-07 DIAGNOSIS — I6322 Cerebral infarction due to unspecified occlusion or stenosis of basilar arteries: Secondary | ICD-10-CM | POA: Diagnosis present

## 2018-05-07 DIAGNOSIS — J988 Other specified respiratory disorders: Secondary | ICD-10-CM

## 2018-05-07 DIAGNOSIS — K117 Disturbances of salivary secretion: Secondary | ICD-10-CM | POA: Diagnosis not present

## 2018-05-07 DIAGNOSIS — I10 Essential (primary) hypertension: Secondary | ICD-10-CM | POA: Diagnosis present

## 2018-05-07 DIAGNOSIS — R1310 Dysphagia, unspecified: Secondary | ICD-10-CM | POA: Diagnosis present

## 2018-05-07 HISTORY — PX: IR CT HEAD LTD: IMG2386

## 2018-05-07 HISTORY — PX: IR PERCUTANEOUS ART THROMBECTOMY/INFUSION INTRACRANIAL INC DIAG ANGIO: IMG6087

## 2018-05-07 HISTORY — PX: IR INTRA CRAN STENT: IMG2345

## 2018-05-07 HISTORY — PX: RADIOLOGY WITH ANESTHESIA: SHX6223

## 2018-05-07 HISTORY — PX: IR ANGIO VERTEBRAL SEL SUBCLAVIAN INNOMINATE UNI L MOD SED: IMG5364

## 2018-05-07 HISTORY — PX: IR ANGIO INTRA EXTRACRAN SEL COM CAROTID INNOMINATE BILAT MOD SED: IMG5360

## 2018-05-07 LAB — POCT I-STAT 7, (LYTES, BLD GAS, ICA,H+H)
Acid-base deficit: 6 mmol/L — ABNORMAL HIGH (ref 0.0–2.0)
BICARBONATE: 18.4 mmol/L — AB (ref 20.0–28.0)
Calcium, Ion: 1.12 mmol/L — ABNORMAL LOW (ref 1.15–1.40)
HEMATOCRIT: 39 % (ref 36.0–46.0)
HEMOGLOBIN: 13.3 g/dL (ref 12.0–15.0)
O2 Saturation: 97 %
Patient temperature: 98.4
Potassium: 3.7 mmol/L (ref 3.5–5.1)
Sodium: 140 mmol/L (ref 135–145)
TCO2: 19 mmol/L — ABNORMAL LOW (ref 22–32)
pCO2 arterial: 30.8 mmHg — ABNORMAL LOW (ref 32.0–48.0)
pH, Arterial: 7.384 (ref 7.350–7.450)
pO2, Arterial: 88 mmHg (ref 83.0–108.0)

## 2018-05-07 LAB — LIPID PANEL
Cholesterol: 273 mg/dL — ABNORMAL HIGH (ref 0–200)
HDL: 76 mg/dL (ref >40)
LDL Cholesterol: 179 mg/dL — ABNORMAL HIGH (ref 0–99)
Total CHOL/HDL Ratio: 3.6 RATIO
Triglycerides: 91 mg/dL (ref <150)
VLDL: 18 mg/dL (ref 0–40)

## 2018-05-07 LAB — HEMOGLOBIN A1C
HEMOGLOBIN A1C: 5.1 % (ref 4.8–5.6)
Mean Plasma Glucose: 99.67 mg/dL

## 2018-05-07 LAB — RAPID URINE DRUG SCREEN, HOSP PERFORMED
Amphetamines: NOT DETECTED
Barbiturates: NOT DETECTED
Benzodiazepines: NOT DETECTED
Cocaine: NOT DETECTED
OPIATES: NOT DETECTED
Tetrahydrocannabinol: NOT DETECTED

## 2018-05-07 LAB — PROTIME-INR
INR: 1.1 (ref 0.8–1.2)
Prothrombin Time: 13.9 seconds (ref 11.4–15.2)

## 2018-05-07 LAB — HIV ANTIBODY (ROUTINE TESTING W REFLEX): HIV Screen 4th Generation wRfx: NONREACTIVE

## 2018-05-07 LAB — MRSA PCR SCREENING: MRSA by PCR: NEGATIVE

## 2018-05-07 LAB — TRIGLYCERIDES: TRIGLYCERIDES: 104 mg/dL (ref <150)

## 2018-05-07 SURGERY — IR WITH ANESTHESIA
Anesthesia: General

## 2018-05-07 MED ORDER — DEXAMETHASONE SODIUM PHOSPHATE 4 MG/ML IJ SOLN
INTRAMUSCULAR | Status: DC | PRN
  Administered 2018-05-07: 5 mg via INTRAVENOUS

## 2018-05-07 MED ORDER — ROCURONIUM BROMIDE 100 MG/10ML IV SOLN
INTRAVENOUS | Status: DC | PRN
  Administered 2018-05-07 (×2): 20 mg via INTRAVENOUS
  Administered 2018-05-07: 30 mg via INTRAVENOUS
  Administered 2018-05-07: 50 mg via INTRAVENOUS
  Administered 2018-05-07: 10 mg via INTRAVENOUS

## 2018-05-07 MED ORDER — TICAGRELOR 60 MG PO TABS
ORAL_TABLET | ORAL | Status: DC | PRN
  Administered 2018-05-07: 180 mg

## 2018-05-07 MED ORDER — NITROGLYCERIN 1 MG/10 ML FOR IR/CATH LAB
INTRA_ARTERIAL | Status: AC
  Filled 2018-05-07: qty 10

## 2018-05-07 MED ORDER — ONDANSETRON HCL 4 MG/2ML IJ SOLN: 4.0000 mg | Freq: Four times a day (QID) | INTRAMUSCULAR | Status: DC | PRN

## 2018-05-07 MED ORDER — PANTOPRAZOLE SODIUM 40 MG IV SOLR
40.0000 mg | Freq: Every day | INTRAVENOUS | Status: DC
  Administered 2018-05-07 – 2018-05-10 (×4): 40 mg via INTRAVENOUS
  Filled 2018-05-07 (×4): qty 40

## 2018-05-07 MED ORDER — CLEVIDIPINE BUTYRATE 0.5 MG/ML IV EMUL
0.0000 mg/h | INTRAVENOUS | Status: DC
  Administered 2018-05-07: 16 mg/h via INTRAVENOUS
  Administered 2018-05-07: 18 mg/h via INTRAVENOUS
  Administered 2018-05-08: 16 mg/h via INTRAVENOUS
  Administered 2018-05-08: 6 mg/h via INTRAVENOUS
  Filled 2018-05-07 (×4): qty 50

## 2018-05-07 MED ORDER — FENTANYL CITRATE (PF) 100 MCG/2ML IJ SOLN
INTRAMUSCULAR | Status: AC
  Filled 2018-05-07: qty 2

## 2018-05-07 MED ORDER — GLYCOPYRROLATE 0.2 MG/ML IJ SOLN
INTRAMUSCULAR | Status: DC | PRN
  Administered 2018-05-07: 0.2 mg via INTRAVENOUS

## 2018-05-07 MED ORDER — CLOPIDOGREL BISULFATE 300 MG PO TABS
ORAL_TABLET | ORAL | Status: AC
  Filled 2018-05-07: qty 1

## 2018-05-07 MED ORDER — SODIUM CHLORIDE (PF) 0.9 % IJ SOLN
INTRAVENOUS | Status: DC | PRN
  Administered 2018-05-07: 25 ug via INTRA_ARTERIAL

## 2018-05-07 MED ORDER — ACETAMINOPHEN 160 MG/5ML PO SOLN: 650.0000 mg | ORAL | Status: DC | PRN

## 2018-05-07 MED ORDER — IOHEXOL 300 MG/ML  SOLN
150.0000 mL | Freq: Once | INTRAMUSCULAR | Status: AC | PRN
  Administered 2018-05-07: 50 mL via INTRA_ARTERIAL

## 2018-05-07 MED ORDER — BREXPIPRAZOLE 2 MG PO TABS
2.0000 mg | ORAL_TABLET | Freq: Every day | ORAL | Status: DC
  Administered 2018-05-08: 2 mg via ORAL
  Filled 2018-05-07 (×3): qty 1

## 2018-05-07 MED ORDER — PROPOFOL 1000 MG/100ML IV EMUL
0.0000 ug/kg/min | INTRAVENOUS | Status: DC
  Administered 2018-05-07 (×2): 50 ug/kg/min via INTRAVENOUS
  Administered 2018-05-08: 02:00:00 20 ug/kg/min via INTRAVENOUS
  Filled 2018-05-07 (×3): qty 100

## 2018-05-07 MED ORDER — ACETAMINOPHEN 650 MG RE SUPP
650.0000 mg | RECTAL | Status: DC | PRN
  Administered 2018-05-07 (×2): 650 mg via RECTAL
  Filled 2018-05-07 (×2): qty 1

## 2018-05-07 MED ORDER — CLEVIDIPINE BUTYRATE 0.5 MG/ML IV EMUL
INTRAVENOUS | Status: AC
  Filled 2018-05-07: qty 50

## 2018-05-07 MED ORDER — LIDOCAINE HCL 1 % IJ SOLN
INTRAMUSCULAR | Status: AC
  Filled 2018-05-07: qty 20

## 2018-05-07 MED ORDER — GADOBUTROL 1 MMOL/ML IV SOLN
8.0000 mL | Freq: Once | INTRAVENOUS | Status: AC | PRN
  Administered 2018-05-07: 8 mL via INTRAVENOUS

## 2018-05-07 MED ORDER — SODIUM CHLORIDE 0.9 % IV SOLN
INTRAVENOUS | Status: DC
  Administered 2018-05-07 – 2018-05-09 (×3): via INTRAVENOUS

## 2018-05-07 MED ORDER — SERTRALINE HCL 50 MG PO TABS
300.0000 mg | ORAL_TABLET | Freq: Every day | ORAL | Status: DC
  Administered 2018-05-08: 300 mg via ORAL
  Filled 2018-05-07: qty 6

## 2018-05-07 MED ORDER — ASPIRIN 81 MG PO CHEW
81.0000 mg | CHEWABLE_TABLET | Freq: Every day | ORAL | Status: DC
  Administered 2018-05-08 – 2018-05-10 (×3): 81 mg
  Filled 2018-05-07 (×3): qty 1

## 2018-05-07 MED ORDER — FENTANYL CITRATE (PF) 100 MCG/2ML IJ SOLN
100.0000 ug | INTRAMUSCULAR | Status: DC | PRN
  Administered 2018-05-07 – 2018-05-09 (×3): 100 ug via INTRAVENOUS
  Filled 2018-05-07 (×2): qty 2

## 2018-05-07 MED ORDER — ACETAMINOPHEN 325 MG PO TABS: 650.0000 mg | ORAL_TABLET | ORAL | Status: DC | PRN

## 2018-05-07 MED ORDER — BISACODYL 10 MG RE SUPP: 10.0000 mg | Freq: Every day | RECTAL | Status: DC | PRN

## 2018-05-07 MED ORDER — PERFLUTREN LIPID MICROSPHERE
INTRAVENOUS | Status: AC
  Filled 2018-05-07: qty 10

## 2018-05-07 MED ORDER — CEFAZOLIN SODIUM-DEXTROSE 2-4 GM/100ML-% IV SOLN
INTRAVENOUS | Status: AC
  Filled 2018-05-07: qty 100

## 2018-05-07 MED ORDER — ASPIRIN 81 MG PO CHEW
81.0000 mg | CHEWABLE_TABLET | Freq: Every day | ORAL | Status: DC
  Filled 2018-05-07 (×2): qty 1

## 2018-05-07 MED ORDER — PROPOFOL 10 MG/ML IV BOLUS
INTRAVENOUS | Status: DC | PRN
  Administered 2018-05-07: 20 mg via INTRAVENOUS
  Administered 2018-05-07: 60 mg via INTRAVENOUS
  Administered 2018-05-07 (×2): 30 mg via INTRAVENOUS
  Administered 2018-05-07: 150 mg via INTRAVENOUS

## 2018-05-07 MED ORDER — TICAGRELOR 90 MG PO TABS: 90.0000 mg | ORAL_TABLET | Freq: Two times a day (BID) | ORAL | Status: DC

## 2018-05-07 MED ORDER — IOPAMIDOL (ISOVUE-370) INJECTION 76%
75.0000 mL | Freq: Once | INTRAVENOUS | Status: AC | PRN
  Administered 2018-05-07: 75 mL via INTRAVENOUS

## 2018-05-07 MED ORDER — NITROGLYCERIN 0.2 MG/ML ON CALL CATH LAB
INTRAVENOUS | Status: DC | PRN
  Administered 2018-05-07: 100 ug via INTRAVENOUS

## 2018-05-07 MED ORDER — NITROGLYCERIN 2 % TD OINT: TOPICAL_OINTMENT | TRANSDERMAL | Status: DC | PRN

## 2018-05-07 MED ORDER — TIROFIBAN HCL IN NACL 5-0.9 MG/100ML-% IV SOLN
INTRAVENOUS | Status: AC
  Filled 2018-05-07: qty 100

## 2018-05-07 MED ORDER — HEPARIN SODIUM (PORCINE) 1000 UNIT/ML IJ SOLN
INTRAMUSCULAR | Status: DC | PRN
  Administered 2018-05-07: 2000 [IU] via INTRAVENOUS

## 2018-05-07 MED ORDER — ATORVASTATIN CALCIUM 80 MG PO TABS
80.0000 mg | ORAL_TABLET | Freq: Every day | ORAL | Status: DC
  Administered 2018-05-07: 80 mg via ORAL
  Filled 2018-05-07: qty 1

## 2018-05-07 MED ORDER — CHLORHEXIDINE GLUCONATE 0.12% ORAL RINSE (MEDLINE KIT)
15.0000 mL | Freq: Two times a day (BID) | OROMUCOSAL | Status: DC
  Administered 2018-05-07 – 2018-05-08 (×2): 15 mL via OROMUCOSAL

## 2018-05-07 MED ORDER — LIDOCAINE HCL (CARDIAC) PF 100 MG/5ML IV SOSY
PREFILLED_SYRINGE | INTRAVENOUS | Status: DC | PRN
  Administered 2018-05-07: 60 mg via INTRAVENOUS

## 2018-05-07 MED ORDER — LACTATED RINGERS IV SOLN
INTRAVENOUS | Status: DC | PRN
  Administered 2018-05-07 (×2): via INTRAVENOUS

## 2018-05-07 MED ORDER — EPTIFIBATIDE 20 MG/10ML IV SOLN
INTRAVENOUS | Status: AC
  Filled 2018-05-07: qty 10

## 2018-05-07 MED ORDER — ORAL CARE MOUTH RINSE
15.0000 mL | OROMUCOSAL | Status: DC
  Administered 2018-05-07 – 2018-05-08 (×3): 15 mL via OROMUCOSAL

## 2018-05-07 MED ORDER — SODIUM CHLORIDE 0.9 % IV BOLUS
1000.0000 mL | Freq: Once | INTRAVENOUS | Status: AC
  Administered 2018-05-07: 1000 mL via INTRAVENOUS

## 2018-05-07 MED ORDER — ASPIRIN 325 MG PO TABS
ORAL_TABLET | ORAL | Status: AC
  Filled 2018-05-07: qty 1

## 2018-05-07 MED ORDER — ASPIRIN 81 MG PO CHEW
CHEWABLE_TABLET | ORAL | Status: DC | PRN
  Administered 2018-05-07: 81 mg

## 2018-05-07 MED ORDER — NITROGLYCERIN IN D5W 200-5 MCG/ML-% IV SOLN
INTRAVENOUS | Status: DC | PRN
  Administered 2018-05-07: 25 ug/min via INTRAVENOUS

## 2018-05-07 MED ORDER — ASPIRIN 81 MG PO CHEW
CHEWABLE_TABLET | ORAL | Status: AC
  Filled 2018-05-07: qty 1

## 2018-05-07 MED ORDER — STROKE: EARLY STAGES OF RECOVERY BOOK
Freq: Once | Status: AC
  Administered 2018-05-07: 06:00:00

## 2018-05-07 MED ORDER — ACETAMINOPHEN 160 MG/5ML PO SOLN
650.0000 mg | ORAL | Status: DC | PRN
  Administered 2018-05-09 – 2018-05-10 (×5): 650 mg
  Filled 2018-05-07 (×5): qty 20.3

## 2018-05-07 MED ORDER — CLEVIDIPINE BUTYRATE 0.5 MG/ML IV EMUL: 0.0000 mg/h | INTRAVENOUS | Status: DC

## 2018-05-07 MED ORDER — PROTAMINE SULFATE 10 MG/ML IV SOLN
INTRAVENOUS | Status: DC | PRN
  Administered 2018-05-07: 20 mg via INTRAVENOUS

## 2018-05-07 MED ORDER — ASPIRIN 325 MG PO TABS: 325.0000 mg | ORAL_TABLET | Freq: Every day | ORAL | Status: DC

## 2018-05-07 MED ORDER — IOPAMIDOL (ISOVUE-370) INJECTION 76%
INTRAVENOUS | Status: AC
  Filled 2018-05-07: qty 100

## 2018-05-07 MED ORDER — ACETAMINOPHEN 650 MG RE SUPP: 650.0000 mg | RECTAL | Status: DC | PRN

## 2018-05-07 MED ORDER — CLEVIDIPINE BUTYRATE 0.5 MG/ML IV EMUL
0.0000 mg/h | INTRAVENOUS | Status: DC
  Administered 2018-05-07: 14 mg/h via INTRAVENOUS
  Filled 2018-05-07: qty 50

## 2018-05-07 MED ORDER — TICAGRELOR 90 MG PO TABS
90.0000 mg | ORAL_TABLET | Freq: Two times a day (BID) | ORAL | Status: DC
  Administered 2018-05-07 – 2018-05-08 (×2): 90 mg
  Filled 2018-05-07 (×2): qty 1

## 2018-05-07 MED ORDER — IOHEXOL 300 MG/ML  SOLN
150.0000 mL | Freq: Once | INTRAMUSCULAR | Status: AC | PRN
  Administered 2018-05-07: 75 mL via INTRA_ARTERIAL

## 2018-05-07 MED ORDER — ACETAMINOPHEN 325 MG PO TABS
650.0000 mg | ORAL_TABLET | ORAL | Status: DC | PRN
  Filled 2018-05-07: qty 2

## 2018-05-07 MED ORDER — CLEVIDIPINE BUTYRATE 0.5 MG/ML IV EMUL
INTRAVENOUS | Status: DC | PRN
  Administered 2018-05-07: 2 mg/h via INTRAVENOUS

## 2018-05-07 MED ORDER — SODIUM CHLORIDE 0.9 % IV SOLN
INTRAVENOUS | Status: DC
  Administered 2018-05-07: 10:00:00 via INTRAVENOUS

## 2018-05-07 MED ORDER — HEPARIN (PORCINE) 25000 UT/250ML-% IV SOLN
1200.0000 [IU]/h | INTRAVENOUS | Status: DC
  Administered 2018-05-07: 1200 [IU]/h via INTRAVENOUS
  Filled 2018-05-07 (×2): qty 250

## 2018-05-07 MED ORDER — DOCUSATE SODIUM 50 MG/5ML PO LIQD: 100.0000 mg | Freq: Two times a day (BID) | ORAL | Status: DC | PRN

## 2018-05-07 MED ORDER — ASPIRIN 300 MG RE SUPP: 300.0000 mg | Freq: Every day | RECTAL | Status: DC

## 2018-05-07 MED ORDER — ONDANSETRON HCL 4 MG/2ML IJ SOLN
INTRAMUSCULAR | Status: DC | PRN
  Administered 2018-05-07: 4 mg via INTRAVENOUS

## 2018-05-07 MED ORDER — SUGAMMADEX SODIUM 200 MG/2ML IV SOLN
INTRAVENOUS | Status: DC | PRN
  Administered 2018-05-07: 200 mg via INTRAVENOUS

## 2018-05-07 MED ORDER — CHLORHEXIDINE GLUCONATE 0.12% ORAL RINSE (MEDLINE KIT): 15.0000 mL | Freq: Two times a day (BID) | OROMUCOSAL | Status: DC

## 2018-05-07 MED ORDER — FENTANYL CITRATE (PF) 100 MCG/2ML IJ SOLN
100.0000 ug | INTRAMUSCULAR | Status: DC | PRN
  Filled 2018-05-07: qty 2

## 2018-05-07 MED ORDER — TICAGRELOR 90 MG PO TABS
ORAL_TABLET | ORAL | Status: AC
  Filled 2018-05-07: qty 2

## 2018-05-07 MED ORDER — FENTANYL CITRATE (PF) 100 MCG/2ML IJ SOLN
INTRAMUSCULAR | Status: DC | PRN
  Administered 2018-05-07: 50 ug via INTRAVENOUS
  Administered 2018-05-07: 100 ug via INTRAVENOUS

## 2018-05-07 MED ORDER — EPTIFIBATIDE 20 MG/10ML IV SOLN
INTRAVENOUS | Status: DC | PRN
  Administered 2018-05-07 (×4): 1.5 mg

## 2018-05-07 MED ORDER — SUCCINYLCHOLINE CHLORIDE 20 MG/ML IJ SOLN
INTRAMUSCULAR | Status: DC | PRN
  Administered 2018-05-07: 100 mg via INTRAVENOUS

## 2018-05-07 MED ORDER — ORAL CARE MOUTH RINSE
15.0000 mL | OROMUCOSAL | Status: DC
  Administered 2018-05-08 (×2): 15 mL via OROMUCOSAL

## 2018-05-07 NOTE — Anesthesia Procedure Notes (Addendum)
Arterial Line Insertion Start/End2020/04/28 12:23 PM, 06-04-18 12:30 PM Performed by: Carmela Rima, CRNA, CRNA  Patient location: OOR procedure area. Preanesthetic checklist: patient identified, IV checked, site marked, surgical consent, monitors and equipment checked, pre-op evaluation, timeout performed and anesthesia consent radial was placed Catheter size: 20 G Seldinger technique used  Attempts: 1 Procedure performed without using ultrasound guided technique. Following insertion, dressing applied and Biopatch. Post procedure assessment: normal  Patient tolerated the procedure well with no immediate complications.

## 2018-05-07 NOTE — Progress Notes (Addendum)
Pt returned to room from IR after cancelled procedure.  Pt had notable fixed L sided gaze, was completely unable to get to midline.  R arm slightly weaker than before as well.  Dr. Pearlean Brownie paged, orders given for 1 L NS bolus and immediate return to IR for arteriogram. Mother escorted down to IR with patient for consent.

## 2018-05-07 NOTE — Anesthesia Postprocedure Evaluation (Signed)
Anesthesia Post Note  Patient: Erika Hernandez  Procedure(s) Performed: IR WITH ANESTHESIA (N/A )     Patient location during evaluation: PACU Anesthesia Type: General Level of consciousness: lethargic and patient uncooperative Pain management: pain level controlled Vital Signs Assessment: post-procedure vital signs reviewed and stable Respiratory status: patient re-intubated, respiratory function unstable and patient on ventilator - see flowsheet for VS Cardiovascular status: blood pressure returned to baseline and stable Anesthetic complications: no Comments: See post-op progress note    Last Vitals:  Vitals:   2018-06-04 1845 2018-06-04 1900  BP: 118/78 115/70  Pulse: 94 (!) 105  Resp: (!) 22 (!) 22  Temp:    SpO2: 97% 96%    Last Pain:  Vitals:   2018/06/04 1745  TempSrc: Axillary  PainSc:                  Erika Hernandez

## 2018-05-07 NOTE — Progress Notes (Signed)
Stroke Team ADDENDUM Note  Patient had gone down for diagnostic cerebral catheter angiogram but after I discussed the case with Dr. Corliss Skains weecided to treat her conservatively with IV heparin as she had remained neurologically stable overnight. However after the patient return back  to the neuro ICU  significant neurological change was noted with fixed left gaze deviation and increased right arm weakness the patient being more drowsy. I examined her again and she was drowsy though arousable and following commands well but forced left gaze deviation and unable to look to the right. Her right upper extremity strength was now 2/5 on the right lower extremity strength remained 4/5. I discussed the case again with the patient's mother as well as Dr. Corliss Skains and it was decided to take the patient down for possible basilar artery revascularization with anesthesia and intubation. The patient's mother understood the risk-benefit and agreed This patient is critically ill and at significant risk of neurological worsening, death and care requires constant monitoring of vital signs, hemodynamics,respiratory and cardiac monitoring, extensive review of multiple databases, frequent neurological assessment, discussion with family, other specialists and medical decision making of high complexity.I have made any additions or clarifications directly to the above note.This critical care time does not reflect procedure time, or teaching time or supervisory time of PA/NP/Med Resident etc but could involve care discussion time.  I spent 30 minutes of neurocritical care time  in the care of  this patient.     Delia Heady, MD Medical Director [facility] Stroke [facility] Pager: [phone] 05/17/2018 2:06 PM

## 2018-05-07 NOTE — Progress Notes (Signed)
RT NOTE: RT transported patient from PACU to 4N32 with RN without complications. RT will continue to monitor.

## 2018-05-07 NOTE — Consult Note (Signed)
Chief Complaint: Patient was seen in consultation today for cerebral arteriogram Chief Complaint  Patient presents with   Numbness   at the request of Dr Lina Sayre   Supervising Physician: Julieanne Cotton  Patient Status: [facility] - In-pt  History of Present Illness: Erika Hernandez is a 28 y.o. female   Developed slurred speech 3/30 3pm-- and within hrs noted diplopia Came to ED Noted left sided numbness per notes Does state she had left neck pain 3-4 days ago  IMPRESSION: MRI head: 1. 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 2. Attenuated LEFT vertebral artery and basilar artery seen with slow flow or occlusion. Recommend CTA HEAD and NECK.  MRI cervical spine: 1. Slow flow versus occluded LEFT vertebral artery. 2. Otherwise negative MRI cervical spine with and without contrast.  IMPRESSION: CTA NECK: 1. 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 Patent RIGHT vertebral artery. 2. Normal bilateral ICA's.  CTA HEAD: 1. 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  Pt now with difficulty speaking Right facial droop and right sided weakness  Evaluation for possible cerebral arteriogram with Dr Corliss Skains Call into Dr Pearlean Brownie  Past Medical History:  Diagnosis Date   Asthma    GAD (generalized anxiety disorder)    Kidney stone    Major depressive disorder    Mixed obsessional thoughts and acts    OCD (obsessive compulsive disorder)     Past Surgical History:  Procedure Laterality Date   ARTHROSCOPIC REPAIR ACL      Allergies: Patient has no known allergies.  Medications: Prior to Admission medications   Medication Sig Start Date End Date Taking? Authorizing Provider  Brexpiprazole (REXULTI) 2 MG TABS Take 2 mg by mouth every morning. 03/19/18   Yes Hurst, Teresa T, PA-C  sertraline (ZOLOFT) 100 MG tablet Take 3 tablets (300 mg total) by mouth daily. 03/19/18  Yes Cherie Ouch, PA-C     Family History  Problem Relation Age of Onset   Kidney Stones Father     Social History   Socioeconomic History   Marital status: Single    Spouse name: Not on file   Number of children: Not on file   Years of education: Not on file   Highest education level: Not on file  Occupational History   Not on file  Social Needs   Financial resource strain: Not on file   Food insecurity:    Worry: Not on file    Inability: Not on file   Transportation needs:    Medical: Not on file    Non-medical: Not on file  Tobacco Use   Smoking status: Never Smoker   Smokeless tobacco: Never Used  Substance and Sexual Activity   Alcohol use: Yes    Alcohol/week: 1.0 standard drinks    Types: 1 Cans of beer per week    Comment: 1-2 per week   Drug use: No   Sexual activity: Not Currently  Lifestyle   Physical activity:    Days per week: Not on file    Minutes per session: Not on file   Stress: Not on file  Relationships   Social connections:    Talks on phone: Not on file    Gets together: Not on file    Attends religious service: Not on file    Active member of club or organization: Not on file    Attends meetings  of clubs or organizations: Not on file    Relationship status: Not on file  Other Topics Concern   Not on file  Social History Narrative   Not on file    Review of Systems: A 12 point ROS discussed and pertinent positives are indicated in the HPI above.  All other systems are negative.  Review of Systems  Constitutional: Positive for activity change.  Respiratory: Positive for cough and shortness of breath.   Cardiovascular: Negative for chest pain.  Gastrointestinal: Negative for abdominal pain.  Musculoskeletal: Negative for back pain.  Neurological: Positive for facial asymmetry, speech difficulty,  weakness and numbness. Negative for dizziness, tremors, seizures, syncope, light-headedness and headaches.  Psychiatric/Behavioral: Negative for agitation and behavioral problems.    Vital Signs: BP 114/72    Pulse (!) 59    Temp 98.5 F (36.9 C) (Oral)    Resp 18    Ht 5\' 8"  (1.727 m)    Wt 187 lb 9.8 oz (85.1 kg)    LMP 05/01/2018 (Exact Date)    SpO2 97%    BMI 28.53 kg/m   Physical Exam Vitals signs reviewed.  Constitutional:      Comments: Rt facial droop Tongue midline  Cardiovascular:     Rate and Rhythm: Normal rate and regular rhythm.     Heart sounds: Normal heart sounds.  Pulmonary:     Effort: Pulmonary effort is normal.     Breath sounds: Normal breath sounds.  Abdominal:     General: Bowel sounds are normal.  Musculoskeletal:     Comments: Right side weak- arm and left Able to partially squeeze my hand; can move leg minimally Left side is stronger and in better control Follows all commands  Dr Pearlean BrownieSethi re examined pt in IR holding room with me  Skin:    General: Skin is warm.  Neurological:     Mental Status: She is alert and oriented to person, place, and time.  Psychiatric:        Behavior: Behavior normal.     Imaging: Ct Angio Head W Or Wo Contrast  Result Date: 17-Oct-2018 CLINICAL DATA:  LEFT-sided numbness this and neck pain. Follow-up infarcts. EXAM: CT ANGIOGRAPHY HEAD AND NECK TECHNIQUE: Multidetector CT imaging of the head and neck was performed using the standard protocol during bolus administration of intravenous contrast. Multiplanar CT image reconstructions and MIPs were obtained to evaluate the vascular anatomy. Carotid stenosis measurements (when applicable) are obtained utilizing NASCET criteria, using the distal internal carotid diameter as the denominator. CONTRAST:  75 cc ISOVUE-370 IOPAMIDOL (ISOVUE-370) INJECTION 76% COMPARISON:  CT HEAD May 06, 2018 and MRI head May 07, 2018. FINDINGS: CTA NECK FINDINGS: AORTIC ARCH: Normal appearance of  the thoracic arch, normal branch pattern. The origins of the innominate, left Common carotid artery and subclavian artery are patent. RIGHT CAROTID SYSTEM: Common carotid artery is patent. Normal appearance of the carotid bifurcation without hemodynamically significant stenosis by NASCET criteria. Normal appearance of the internal carotid artery. LEFT CAROTID SYSTEM: Common carotid artery is patent. Normal appearance of the carotid bifurcation without hemodynamically significant stenosis by NASCET criteria. Normal appearance of the internal carotid artery. VERTEBRAL ARTERIES:LEFT vertebral artery occluded from origin without reconstitution in the neck. Normal RIGHT vertebral artery. SKELETON: No acute osseous process though bone windows have not been submitted. OTHER NECK: Soft tissues of the neck are nonacute though, not tailored for evaluation. UPPER CHEST: Mild heterogeneous lung attenuation seen with small airway disease. CTA HEAD FINDINGS: ANTERIOR CIRCULATION:  Patent cervical internal carotid arteries, petrous, cavernous and supra clinoid internal carotid arteries. Patent anterior communicating artery. Patent anterior and middle cerebral arteries. No large vessel occlusion, flow-limiting stenosis, contrast extravasation or aneurysm. 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 Occluded distal RIGHT vertebral artery. Occluded vertebrobasilar junction and basilar artery with collateral vessel within interpeduncular notch. Reconstitution of basilar artery at tip. Robust posterior communicating arteries present. No large vessel occlusion, flow-limiting stenosis, contrast extravasation or aneurysm. VENOUS SINUSES: Major dural venous sinuses are patent though not tailored for evaluation on this angiographic examination. ANATOMIC VARIANTS: None. DELAYED PHASE: Similar nodular enhancement LEFT > RIGHT occipital lobes corresponding to subacute infarcts. MIP images reviewed. IMPRESSION:  CTA NECK: 1. 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 Patent RIGHT vertebral artery. 2. Normal bilateral ICA's. CTA HEAD: 1. 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 Acute findings discussed with and reconfirmed by Dr.Kirkpatrick, Neurology on 04/25/2018 at 4:01 am. Electronically Signed   By: Awilda Metro M.D.   On: 04/29/2018 04:03   Ct Head Wo Contrast  Result Date: 04/13/2018 CLINICAL DATA:  28 year old female with left leg numbness and weakness. EXAM: CT HEAD WITHOUT CONTRAST TECHNIQUE: Contiguous axial images were obtained from the base of the skull through the vertex without intravenous contrast. COMPARISON:  None. FINDINGS: Brain: The ventricles and sulci appropriate size for patient's age. Patchy areas of hypodensity in the cerebellar hemispheres bilaterally, left greater right, most consistent with areas of infarct of indeterminate age. Acute infarct is not excluded. Clinical correlation is recommended. If there is clinical concern for acute infarct further evaluation with MRI is recommended. A somewhat linear area hypodensity in the right cerebellar hemisphere likely represents an old infarct. There is no acute intracranial hemorrhage. No mass effect or midline shift. No extra-axial fluid collection. Vascular: No hyperdense vessel or unexpected calcification. Skull: Normal. Negative for fracture or focal lesion. Sinuses/Orbits: No acute finding. Other: None IMPRESSION: 1. No acute intracranial hemorrhage. 2. Patchy areas of hypodensity in the cerebellar hemispheres bilaterally, left greater right, most consistent with areas of infarct of indeterminate age. Acute infarct is not excluded. Clinical correlation is recommended. If there is clinical concern for acute infarct further evaluation with MRI recommended. These results were called by  telephone at the time of interpretation on 04/09/2018 at 9:25 pm to Dr. Loren Racer , who verbally acknowledged these results. Electronically Signed   By: Elgie Collard M.D.   On: 04/08/2018 21:31   Ct Angio Neck W And/or Wo Contrast  Result Date: 04/30/2018 CLINICAL DATA:  LEFT-sided numbness this and neck pain. Follow-up infarcts. EXAM: CT ANGIOGRAPHY HEAD AND NECK TECHNIQUE: Multidetector CT imaging of the head and neck was performed using the standard protocol during bolus administration of intravenous contrast. Multiplanar CT image reconstructions and MIPs were obtained to evaluate the vascular anatomy. Carotid stenosis measurements (when applicable) are obtained utilizing NASCET criteria, using the distal internal carotid diameter as the denominator. CONTRAST:  75 cc ISOVUE-370 IOPAMIDOL (ISOVUE-370) INJECTION 76% COMPARISON:  CT HEAD May 06, 2018 and MRI head May 07, 2018. FINDINGS: CTA NECK FINDINGS: AORTIC ARCH: Normal appearance of the thoracic arch, normal branch pattern. The origins of the innominate, left Common carotid artery and subclavian artery are patent. RIGHT CAROTID SYSTEM: Common carotid artery is patent. Normal appearance of the carotid bifurcation without hemodynamically significant stenosis by NASCET criteria. Normal appearance of the internal carotid artery. LEFT CAROTID SYSTEM: Common  carotid artery is patent. Normal appearance of the carotid bifurcation without hemodynamically significant stenosis by NASCET criteria. Normal appearance of the internal carotid artery. VERTEBRAL ARTERIES:LEFT vertebral artery occluded from origin without reconstitution in the neck. Normal RIGHT vertebral artery. SKELETON: No acute osseous process though bone windows have not been submitted. OTHER NECK: Soft tissues of the neck are nonacute though, not tailored for evaluation. UPPER CHEST: Mild heterogeneous lung attenuation seen with small airway disease. CTA HEAD FINDINGS: ANTERIOR  CIRCULATION: Patent cervical internal carotid arteries, petrous, cavernous and supra clinoid internal carotid arteries. Patent anterior communicating artery. Patent anterior and middle cerebral arteries. No large vessel occlusion, flow-limiting stenosis, contrast extravasation or aneurysm. 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 Occluded distal RIGHT vertebral artery. Occluded vertebrobasilar junction and basilar artery with collateral vessel within interpeduncular notch. Reconstitution of basilar artery at tip. Robust posterior communicating arteries present. No large vessel occlusion, flow-limiting stenosis, contrast extravasation or aneurysm. VENOUS SINUSES: Major dural venous sinuses are patent though not tailored for evaluation on this angiographic examination. ANATOMIC VARIANTS: None. DELAYED PHASE: Similar nodular enhancement LEFT > RIGHT occipital lobes corresponding to subacute infarcts. MIP images reviewed. IMPRESSION: CTA NECK: 1. 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 Patent RIGHT vertebral artery. 2. Normal bilateral ICA's. CTA HEAD: 1. 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 Acute findings discussed with and reconfirmed by Dr.Kirkpatrick, Neurology on 04/19/2018 at 4:01 am. Electronically Signed   By: Awilda Metro M.D.   On: 04/21/2018 04:03   Mr Brain W And Wo Contrast  Result Date: 04/17/2018 CLINICAL DATA:  LEFT neck pain for a few days. LEFT arm numbness beginning this afternoon and transient slurred speech. EXAM: MRI HEAD WITHOUT AND WITH CONTRAST MRI CERVICAL SPINE WITHOUT AND WITH CONTRAST TECHNIQUE: Multiplanar, multiecho pulse sequences of the brain and surrounding structures, and cervical spine, to include the craniocervical junction and cervicothoracic junction, were obtained without  and with intravenous contrast. CONTRAST:  8 cc Gadavist COMPARISON:  CT HEAD May 06, 2018 FINDINGS: MRI HEAD FINDINGS INTRACRANIAL CONTENTS: Patchy LEFT > RIGHT cerebellar and pontine reduced diffusion with low ADC values and to lesser extent normalized ADC values. Nodular enhancement of LEFT > RIGHT occipital lobes consistent with subacute infarct. Old small RIGHT cerebellar, LEFT splenium of corpus callosum, LEFT periventricular white matter and LEFT occipital infarcts. No midline shift, masses or abnormal extra-axial fluid collections. No parenchymal brain volume loss for age. No hydrocephalus. No abnormal parenchymal or extra-axial enhancement though post gadolinium sequences are moderately motion degraded. VASCULAR: Slightly attenuated LEFT vertebral artery flow void in the neck. Attenuated basilar artery flow void. SKULL AND UPPER CERVICAL SPINE: No abnormal sellar expansion. No suspicious calvarial bone marrow signal. Craniocervical junction maintained. SINUSES/ORBITS: The mastoid air-cells and included paranasal sinuses are well-aerated.The included ocular globes and orbital contents are non-suspicious. OTHER: None. MRI CERVICAL SPINE FINDINGS ALIGNMENT: Straightened cervical lordosis.  No malalignment. VERTEBRAE/DISCS: Vertebral bodies are intact. Intervertebral disc morphology's and signal are normal. No abnormal or acute bone marrow signal. No abnormal osseous or disc enhancement. CORD:Cervical spinal cord is normal morphology and signal characteristics from the cervicomedullary junction to level of T2-3, the most caudal well visualized level. POSTERIOR FOSSA, VERTEBRAL ARTERIES, PARASPINAL TISSUES: No MR findings of ligamentous injury. Attenuated LEFT vertebral artery flow void. DISC LEVELS: No disc bulge, canal stenosis nor neural foraminal narrowing. IMPRESSION: MRI head: 1. Multifocal acute, subacute and chronic posterior circulation nonhemorrhagic  infarcts. 2. Attenuated LEFT vertebral artery and  basilar artery seen with slow flow or occlusion. Recommend CTA HEAD and NECK. MRI cervical spine: 1. Slow flow versus occluded LEFT vertebral artery. 2. Otherwise negative MRI cervical spine with and without contrast. Acute findings discussed with and reconfirmed by Dr.KRISTEN WARD on 05/23/18 at 3:14 am. Electronically Signed   By: Awilda Metro M.D.   On: 05/23/18 03:20   Mr Cervical Spine W Or Wo Contrast  Result Date: May 23, 2018 CLINICAL DATA:  LEFT neck pain for a few days. LEFT arm numbness beginning this afternoon and transient slurred speech. EXAM: MRI HEAD WITHOUT AND WITH CONTRAST MRI CERVICAL SPINE WITHOUT AND WITH CONTRAST TECHNIQUE: Multiplanar, multiecho pulse sequences of the brain and surrounding structures, and cervical spine, to include the craniocervical junction and cervicothoracic junction, were obtained without and with intravenous contrast. CONTRAST:  8 cc Gadavist COMPARISON:  CT HEAD May 06, 2018 FINDINGS: MRI HEAD FINDINGS INTRACRANIAL CONTENTS: Patchy LEFT > RIGHT cerebellar and pontine reduced diffusion with low ADC values and to lesser extent normalized ADC values. Nodular enhancement of LEFT > RIGHT occipital lobes consistent with subacute infarct. Old small RIGHT cerebellar, LEFT splenium of corpus callosum, LEFT periventricular white matter and LEFT occipital infarcts. No midline shift, masses or abnormal extra-axial fluid collections. No parenchymal brain volume loss for age. No hydrocephalus. No abnormal parenchymal or extra-axial enhancement though post gadolinium sequences are moderately motion degraded. VASCULAR: Slightly attenuated LEFT vertebral artery flow void in the neck. Attenuated basilar artery flow void. SKULL AND UPPER CERVICAL SPINE: No abnormal sellar expansion. No suspicious calvarial bone marrow signal. Craniocervical junction maintained. SINUSES/ORBITS: The mastoid air-cells and included paranasal sinuses are well-aerated.The included ocular globes and  orbital contents are non-suspicious. OTHER: None. MRI CERVICAL SPINE FINDINGS ALIGNMENT: Straightened cervical lordosis.  No malalignment. VERTEBRAE/DISCS: Vertebral bodies are intact. Intervertebral disc morphology's and signal are normal. No abnormal or acute bone marrow signal. No abnormal osseous or disc enhancement. CORD:Cervical spinal cord is normal morphology and signal characteristics from the cervicomedullary junction to level of T2-3, the most caudal well visualized level. POSTERIOR FOSSA, VERTEBRAL ARTERIES, PARASPINAL TISSUES: No MR findings of ligamentous injury. Attenuated LEFT vertebral artery flow void. DISC LEVELS: No disc bulge, canal stenosis nor neural foraminal narrowing. IMPRESSION: MRI head: 1. 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 2. Attenuated LEFT vertebral artery and basilar artery seen with slow flow or occlusion. Recommend CTA HEAD and NECK. MRI cervical spine: 1. Slow flow versus occluded LEFT vertebral artery. 2. Otherwise negative MRI cervical spine with and without contrast. Acute findings discussed with and reconfirmed by Dr.KRISTEN WARD on 2018-05-23 at 3:14 am. Electronically Signed   By: Awilda Metro M.D.   On: 2018/05/23 03:20   Dg Chest Port 1 View  Result Date: 23-May-2018 CLINICAL DATA:  Stroke EXAM: PORTABLE CHEST 1 VIEW COMPARISON:  None. FINDINGS: Normal heart size and mediastinal contours. No acute infiltrate or edema. No effusion or pneumothorax. No acute osseous findings. IMPRESSION: Negative chest. Electronically Signed   By: Marnee Spring M.D.   On: 2018-05-23 06:26    Labs:  CBC: Recent Labs    02/25/18 1047 04/16/2018 2052  WBC 5.5 9.2  HGB 14.7 14.5  HCT 43.2 45.2  PLT 219 231    COAGS: Recent Labs    23-May-2018 0903  INR 1.1    BMP: Recent Labs    02/25/18 1047 04/26/2018 2052  NA 140 136  K 4.2 3.7  CL 107* 107  CO2 21 22  GLUCOSE 87 110*  BUN 10 12  CALCIUM 9.1 8.9  CREATININE  0.77 0.65  GFRNONAA 105 >60  GFRAA 121 >60    LIVER FUNCTION TESTS: Recent Labs    02/25/18 1047 04/16/2018 2052  BILITOT 0.3 0.5  AST 21 22  ALT 14 16  ALKPHOS 71 80  PROT 6.0 6.6  ALBUMIN 3.5* 3.2*    TUMOR MARKERS: No results for input(s): AFPTM, CEA, CA199, CHROMGRNA in the last 8760 hours.  Assessment and Plan:  CVA Right sided weakness Now on Heparin Discussed with Dr Pearlean Brownie and Dr Corliss Skains Exam is unchanged from this am per Dr Pearlean Brownie Will HOLD off on Cerebral arteriogram for now Stroke team will recheck pt - call us if need us---Dr Deveshwar aware RN aware-- mother will be updated   Thank you for this interesting consult.  I greatly enjoyed meeting Briaunna Mabey and look forward to participating in their care.  A copy of this report was sent to the requesting provider on this date.  Electronically Signed: Robet Leu, PA-C 05-08-2018, 10:42 AM   I spent a total of 40 Minutes    in face to face in clinical consultation, greater than 50% of which was counseling/coordinating care for consideration of cerebral arteriogram

## 2018-05-07 NOTE — ED Notes (Signed)
Pt to radiology via stretcher.  

## 2018-05-07 NOTE — Progress Notes (Signed)
PT Cancellation Note  Patient Details Name: Erika Hernandez MRN: [id number] DOB: 1990/04/04   Cancelled Treatment:    Reason Eval/Treat Not Completed: Patient not medically ready(pt high risk and for potential intervention will hold today)   Sabian Kuba B Jaimey Franchini 2018-05-13, 8:05 AM  Delaney Meigs, PT Acute Rehabilitation Services Pager: [phone] Office: [phone]

## 2018-05-07 NOTE — Progress Notes (Signed)
  Echocardiogram 2D Echocardiogram has been performed.  Delcie Roch 04/14/2018, 9:01 AM

## 2018-05-07 NOTE — Progress Notes (Signed)
OT Cancellation Note  Patient Details Name: Cristine Martincic MRN: [id number] DOB: 14-Apr-1990   Cancelled Treatment:    Reason Eval/Treat Not Completed: Patient not medically ready. Will return as pt medically ready and as schedule allows. Thank you.  Miranda Garber M Anamae Rochelle Maverick Patman MSOT, OTR/L Acute Rehab Pager: [phone] Office: [phone] 04/20/2018, 8:14 AM

## 2018-05-07 NOTE — Procedures (Signed)
S/P Rt vert arteriogram followed by complete revascularization of  Entire basilar  Artery with x 2 passes with 48mm x 63mm embotrap retrieverdevice and x 1 pass with 74mm x 40 mm solitaire FR retriever device achieving a TICI 3 revascularization and placement od enterprise stent atsite of flow limiting stenosis in the prox bailar artery with a TICI 3 revascularization

## 2018-05-07 NOTE — Progress Notes (Signed)
Transported patient to CT scan on ventilator... no issues to report.

## 2018-05-07 NOTE — ED Notes (Signed)
Pt in MRI at this time 

## 2018-05-07 NOTE — Anesthesia Preprocedure Evaluation (Addendum)
Anesthesia Evaluation  Patient identified by MRN, date of birth, ID band Patient confused    Reviewed: Allergy & Precautions, NPO status , Patient's Chart, lab work & pertinent test resultsPreop documentation limited or incomplete due to emergent nature of procedure.  Airway Mallampati: II  TM Distance: >3 FB Neck ROM: Full    Dental  (+) Dental Advisory Given, Chipped,    Pulmonary asthma ,    Pulmonary exam normal breath sounds clear to auscultation       Cardiovascular negative cardio ROS Normal cardiovascular exam Rhythm:Regular Rate:Normal     Neuro/Psych PSYCHIATRIC DISORDERS Anxiety Depression OCDCTA NECK: 1. 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 Patent RIGHT vertebral artery. 2. Normal bilateral ICA's.  CTA HEAD: 1. 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 CVA, Residual Symptoms    GI/Hepatic negative GI ROS, Neg liver ROS,   Endo/Other  negative endocrine ROS  Renal/GU negative Renal ROS     Musculoskeletal negative musculoskeletal ROS (+)   Abdominal   Peds  Hematology negative hematology ROS (+)   Anesthesia Other Findings Day of surgery medications reviewed with the patient.  Reproductive/Obstetrics                             Anesthesia Physical Anesthesia Plan  ASA: V and emergent  Anesthesia Plan: General   Post-op Pain Management:    Induction: Intravenous and Rapid sequence  PONV Risk Score and Plan: 3 and Dexamethasone, Ondansetron and Treatment may vary due to age or medical condition  Airway Management Planned: Oral ETT  Additional Equipment:   Intra-op Plan:   Post-operative Plan: Possible Post-op intubation/ventilation  Informed Consent: I have reviewed the patients History and Physical,  chart, labs and discussed the procedure including the risks, benefits and alternatives for the proposed anesthesia with the patient or authorized representative who has indicated his/her understanding and acceptance.     Dental advisory given and Only emergency history available  Plan Discussed with: CRNA  Anesthesia Plan Comments: (Pre-op completed in person with patient prior to induction. Due to emergent nature of procedure, documentation of pre-op H&P completed at a later time.)       Anesthesia Quick Evaluation

## 2018-05-07 NOTE — Progress Notes (Signed)
Patient ID: Erika Hernandez, female   DOB: 04-30-1990, 28 y.o.   MRN: [id number] INR  Post treament CT no ICH or mass effect .Ventricles symmetrical without hydroce3phalus. Pupils approx 51mm Sluggish. Opens eyes intermittently to  Command. MaintainingO2 sats. RT groin soft. No bleeding.Distal pulses palpable DPs and PTs bilaterally S.Trevonne Nyland MD

## 2018-05-07 NOTE — Progress Notes (Signed)
SLP Cancellation Note  Patient Details Name: Erika Hernandez MRN: [id number] DOB: 1990/12/30   Cancelled treatment:       Reason Eval/Treat Not Completed: Medical issues which prohibited therapy;Patient at procedure or test/unavailable. SLP contacted Asher Muir, RN to assess whether pt was back on unit and able to participate in SLP evaluations; however, she reported that the pt is still in IR and will likely return intubated. SLP will continue to follow pt for evaluation when appropriate.   Robbi Spells I. Vear Clock, MS, CCC-SLP Acute Rehabilitation Services Office number [phone] Pager [phone]  Scheryl Marten 05/06/2018, 4:07 PM

## 2018-05-07 NOTE — H&P (Signed)
Neurology Consultation Reason for Consult: Slurred Speech Referring Physician: Ward, K  CC: Slurred Speech  History is obtained from:patient  HPI: Erika Hernandez is a 28 y.o. female with no significant past medical history who presents with slurred speech that started acutely around 3 PM.  Subsequently around 8 PM she developed diplopia and left sided numbness. This prompted her to come to the emergency department at [facility] where CT showed cerebellar hypodensity.   She states that she has had left neck pain for the past 3 days, but denies any antecedent injury to her neck or wrenching movements or other potential causes.  She denies any previous episodes of vertigo or similar symptoms.   LKW: 3 PM 3/30 tpa given?: no, outside of window Premorbid modified rankin scale: 0 NIHSS: 7(1 for sensation, 2 for ataxia, 1 for facial weakness, 2 for gaze deviation)   ROS: A 14 point ROS was performed and is negative except as noted in the HPI.   Past Medical History:  Diagnosis Date  . Asthma   . GAD (generalized anxiety disorder)   . Kidney stone   . Major depressive disorder   . Mixed obsessional thoughts and acts   . OCD (obsessive compulsive disorder)      Family History  Problem Relation Age of Onset  . Kidney Stones Father      Social History:  reports that she has never smoked. She has never used smokeless tobacco. She reports current alcohol use of about 1.0 standard drinks of alcohol per week. She reports that she does not use drugs.   Exam: Current vital signs: BP 121/88   Pulse (!) 57   Temp 98.1 F (36.7 C) (Oral)   Resp 18   Ht 5\' 8"  (1.727 m)   Wt 83.9 kg   LMP 05/01/2018 (Exact Date)   SpO2 97%   BMI 28.13 kg/m  Vital signs in last 24 hours: Temp:  [97.9 F (36.6 C)-98.1 F (36.7 C)] 98.1 F (36.7 C) (03/30 2314) Pulse Rate:  [54-69] 57 (03/31 0100) Resp:  [10-19] 18 (03/31 0100) BP: (118-135)/(84-98) 121/88 (03/31 0100) SpO2:  [96  %-99 %] 97 % (03/31 0100) Weight:  [83.9 kg] 83.9 kg (03/30 2038)   Physical Exam  Constitutional: Appears well-developed and well-nourished.  Psych: Affect appropriate to situation Eyes: No scleral injection HENT: No OP obstrucion Head: Normocephalic.  Cardiovascular: Normal rate and regular rhythm.  Respiratory: Effort normal, non-labored breathing GI: Soft.  No distension. There is no tenderness.  Skin: WDI  Neuro: Mental Status: Patient is awake, alert, oriented to person, place, month, year, and situation. Patient is able to give a clear and coherent history. No signs of aphasia or neglect Cranial Nerves: II: Visual Fields are full.  Right pupil is 3 mm and reactive, left pupil is 5 mm and reactive III,IV, VI: She has a left gaze deviation V: Facial sensation diminished on the left VII: Facial movement with right facial weakness VIII: hearing is intact to voice X: Uvula elevates symmetrically XI: Shoulder shrug is symmetric. XII: tongue is midline without atrophy or fasciculations.  Motor: Tone is normal. Bulk is normal.  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  I suspect that she had a left vertebral dissection several days ago with subsequent progression to include the basilar tonight.  At this time, she is now more than 12 hours out from onset and after discussion with the patient and Dr. Corliss Skains, we are going to admit to the ICU with IV heparin.  If there is any progression of her symptoms  after starting IV heparin, then consideration of thrombectomy.  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  Recommendations: -ICU admission for close monitoring and frequent neuro checks -Consideration of IR intervention if she has any worsening. - HgbA1c, fasting lipid panel - Frequent neuro checks - Echocardiogram - Prophylactic therapy-IV heparin - Telemetry monitoring - PT consult, OT consult, Speech consult - Stroke team to follow   This patient is critically ill and at significant risk of neurological worsening, death and care requires constant monitoring of vital signs, hemodynamics,respiratory and cardiac monitoring, neurological assessment, discussion with family, other specialists and medical decision making of high complexity. I spent 65 minutes of neurocritical care time  in the care of  this patient. This was time spent independent of any time provided by nurse practitioner or PA.  Ritta Slot, MD [facility] [phone]  If 7pm- 7am, please page neurology on call as listed in AMION. 05/16/18  5:07 AM

## 2018-05-07 NOTE — Progress Notes (Signed)
Patient ID: Erika Hernandez, female   DOB: 1990-07-15, 28 y.o.   MRN: [id number] yr RT H F mRSS 0. 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  Initial Ct brain revealing  Subacute bilateral cerebellar infarcts. Also noted occluded Lt VA and prox basilar artery. Endovascular revascularization D/W with mother.Procedure,reasons,alternatives reviewed. Risks of ICH of 10 % ,worsening neuro function,vent dependency,death,inability to revascularize and vascular injury discussed.Mother expressed understanding and gave witnessed consent to proceed. S.Nnaemeka Samson MD

## 2018-05-07 NOTE — Progress Notes (Signed)
ANTICOAGULATION CONSULT NOTE - Initial Consult  Pharmacy Consult for Heparin Indication: vertebral dissection  No Known Allergies  Patient Measurements: Height: 5\' 8"  (172.7 cm) Weight: 185 lb (83.9 kg) IBW/kg (Calculated) : 63.9 Heparin Dosing Weight: 80 kg  Vital Signs: Temp: 98.1 F (36.7 C) (03/30 2314) Temp Source: Oral (03/30 2314) BP: 123/71 (03/31 0420) Pulse Rate: 61 (03/31 0420)  Labs: Recent Labs    04/24/2018 2052  HGB 14.5  HCT 45.2  PLT 231  CREATININE 0.65    Estimated Creatinine Clearance: 117.8 mL/min (by C-G formula based on SCr of 0.65 mg/dL).   Medical History: Past Medical History:  Diagnosis Date  . Asthma   . GAD (generalized anxiety disorder)   . Kidney stone   . Major depressive disorder   . Mixed obsessional thoughts and acts   . OCD (obsessive compulsive disorder)     Medications:  No current facility-administered medications on file prior to encounter.    Current Outpatient Medications on File Prior to Encounter  Medication Sig Dispense Refill  . Brexpiprazole (REXULTI) 2 MG TABS Take 2 mg by mouth every morning. 30 tablet 5  . sertraline (ZOLOFT) 100 MG tablet Take 3 tablets (300 mg total) by mouth daily. 270 tablet 1     Assessment: 28 y.o. female with CVA and L vertebral artery dissection for heparin  Goal of Therapy:  Heparin level 0.3-0.5 Monitor platelets by anticoagulation protocol: Yes   Plan:  Start heparin 1200 units/hr Check heparin level in 8 hours.  Geannie Risen, PharmD, BCPS   Christyana Corwin, Gary Fleet May 18, 2018,4:37 AM

## 2018-05-07 NOTE — Transfer of Care (Signed)
Immediate Anesthesia Transfer of Care Note  Patient: Erika Hernandez  Procedure(s) Performed: IR WITH ANESTHESIA (N/A )  Patient Location: PACU  Anesthesia Type:General  Level of Consciousness: drowsy and responds to stimulation. Opens eyes to verbal stimuli.    Airway & Oxygen Therapy: Patient connected to face mask oxygen. Oral airway remains in place.   Post-op Assessment: Report given to RN and Post -op Vital signs reviewed and stable  Post vital signs: Reviewed and stable Cleviprex titrated to 8mg /hr once arrival to PACU for bp systolic 151  Last Vitals:  Vitals Value Taken Time  BP 116/84 04/25/2018  4:12 PM  Temp 36.4 C 04/24/2018  4:15 PM  Pulse 98 04/23/2018  4:17 PM  Resp 30 04/19/2018  4:17 PM  SpO2 94 % 04/12/2018  4:17 PM  Vitals shown include unvalidated device data.  Last Pain:  Vitals:   04/29/2018 1615  TempSrc: Tympanic  PainSc:          Complications: No apparent anesthesia complications

## 2018-05-07 NOTE — Progress Notes (Signed)
STROKE TEAM PROGRESS NOTE   INTERVAL HISTORY 28 year old Caucasian lady with no significant past neurological history with subacute headache neck pain as well as worsening and neurlogical symptoms of headache, gait ataxia, slurred speech,numbness  and diplopia. She was started on IV heparin and has remained stable overnight. MRI scan shows a combination of chronic cerebellar, subacute right occipital condyle is acute left occipital periventricular and brainstem infarcts. 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 No prior history of DVT, pulmonary embolism and no family history stroke in young as per patient's mother was present bedside throughout this visit Vitals:   04/25/2018 0630 04/27/2018 0700 04/27/2018 0730 05/02/2018 0800  BP: 119/72 115/82 125/79 111/66  Pulse: (!) 55 (!) 58 76 60  Resp: (!) Temp:    98.5 F (36.9 C)  TempSrc:    Oral  SpO2: 98% 100% 98% 98%  Weight:      Height:        CBC:  Recent Labs  Lab 05/28/2018 2052  WBC 9.2  NEUTROABS 5.9  HGB 14.5  HCT 45.2  MCV 93.2  PLT 231    Basic Metabolic Panel:  Recent Labs  Lab 2018/05/28 2052  NA 136  K 3.7  CL 107  CO2 22  GLUCOSE 110*  BUN 12  CREATININE 0.65  CALCIUM 8.9   Lipid Panel:     Component Value Date/Time   CHOL 273 (H) 04/23/2018 0449   CHOL 227 (H) 02/25/2018 1047   TRIG 91 04/26/2018 0449   HDL 76 04/30/2018 0449   HDL 64 02/25/2018 1047   CHOLHDL 3.6 04/19/2018 0449   VLDL 18 04/15/2018 0449   LDLCALC 179 (H) 04/25/2018 0449   LDLCALC 147 (H) 02/25/2018 1047   HgbA1c:  Lab Results  Component Value Date   HGBA1C 5.1 04/07/2018   Urine Drug Screen: No results found for: LABOPIA, COCAINSCRNUR, LABBENZ, AMPHETMU, THCU, LABBARB  Alcohol Level No results found  for: ETH  IMAGING Ct Angio Head W Or Wo Contrast  Result Date: 04/22/2018 CLINICAL DATA:  LEFT-sided numbness this and neck pain. Follow-up infarcts. EXAM: CT ANGIOGRAPHY HEAD AND NECK TECHNIQUE: Multidetector CT imaging of the head and neck was performed using the standard protocol during bolus administration of intravenous contrast. Multiplanar CT image reconstructions and MIPs were obtained to evaluate the vascular anatomy. Carotid stenosis measurements (when applicable) are obtained utilizing NASCET criteria, using the distal internal carotid diameter as the denominator. CONTRAST:  75 cc ISOVUE-370 IOPAMIDOL (ISOVUE-370) INJECTION 76% COMPARISON:  CT HEAD May 28, 2018 and MRI head May 07, 2018. FINDINGS: CTA NECK FINDINGS: AORTIC ARCH: Normal appearance of the thoracic arch, normal branch pattern. The origins of the innominate, left Common carotid artery and subclavian artery are patent. RIGHT CAROTID SYSTEM: Common carotid artery is patent. Normal appearance of the carotid bifurcation without hemodynamically significant stenosis by NASCET criteria. Normal appearance of the internal carotid artery. LEFT CAROTID SYSTEM: Common carotid artery is patent. Normal appearance of the carotid bifurcation without hemodynamically significant stenosis by NASCET criteria. Normal appearance of the internal carotid artery. VERTEBRAL ARTERIES:LEFT vertebral artery occluded from origin without reconstitution in the neck. Normal RIGHT vertebral artery. SKELETON: No acute osseous process though bone windows have not been submitted. OTHER NECK: Soft tissues of the neck are nonacute though, not tailored for evaluation. UPPER  CHEST: Mild heterogeneous lung attenuation seen with small airway disease. CTA HEAD FINDINGS: ANTERIOR CIRCULATION: Patent cervical internal carotid arteries, petrous, cavernous and supra clinoid internal carotid arteries. Patent anterior communicating artery. Patent anterior and middle cerebral  arteries. No large vessel occlusion, flow-limiting stenosis, contrast extravasation or aneurysm. 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 Occluded distal RIGHT vertebral artery. Occluded vertebrobasilar junction and basilar artery with collateral vessel within interpeduncular notch. Reconstitution of basilar artery at tip. Robust posterior communicating arteries present. No large vessel occlusion, flow-limiting stenosis, contrast extravasation or aneurysm. VENOUS SINUSES: Major dural venous sinuses are patent though not tailored for evaluation on this angiographic examination. ANATOMIC VARIANTS: None. DELAYED PHASE: Similar nodular enhancement LEFT > RIGHT occipital lobes corresponding to subacute infarcts. MIP images reviewed. IMPRESSION: CTA NECK: 1. 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 Patent RIGHT vertebral artery. 2. Normal bilateral ICA's. CTA HEAD: 1. 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 Acute findings discussed with and reconfirmed by Dr.Kirkpatrick, Neurology on 04/22/2018 at 4:01 am. Electronically Signed   By: Awilda Metro M.D.   On: 04/26/2018 04:03   Ct Head Wo Contrast  Result Date: 21-May-2018 CLINICAL DATA:  28 year old female with left leg numbness and weakness. EXAM: CT HEAD WITHOUT CONTRAST TECHNIQUE: Contiguous axial images were obtained from the base of the skull through the vertex without intravenous contrast. COMPARISON:  None. FINDINGS: Brain: The ventricles and sulci appropriate size for patient's age. Patchy areas of hypodensity in the cerebellar hemispheres bilaterally, left greater right, most consistent with areas of infarct of indeterminate age. Acute infarct is not excluded. Clinical correlation is recommended. If there is clinical concern for acute infarct further  evaluation with MRI is recommended. A somewhat linear area hypodensity in the right cerebellar hemisphere likely represents an old infarct. There is no acute intracranial hemorrhage. No mass effect or midline shift. No extra-axial fluid collection. Vascular: No hyperdense vessel or unexpected calcification. Skull: Normal. Negative for fracture or focal lesion. Sinuses/Orbits: No acute finding. Other: None IMPRESSION: 1. No acute intracranial hemorrhage. 2. Patchy areas of hypodensity in the cerebellar hemispheres bilaterally, left greater right, most consistent with areas of infarct of indeterminate age. Acute infarct is not excluded. Clinical correlation is recommended. If there is clinical concern for acute infarct further evaluation with MRI recommended. These results were called by telephone at the time of interpretation on 21-May-2018 at 9:25 pm to Dr. Loren Racer , who verbally acknowledged these results. Electronically Signed   By: Elgie Collard M.D.   On: May 21, 2018 21:31   Ct Angio Neck W And/or Wo Contrast  Result Date: 04/20/2018 CLINICAL DATA:  LEFT-sided numbness this and neck pain. Follow-up infarcts. EXAM: CT ANGIOGRAPHY HEAD AND NECK TECHNIQUE: Multidetector CT imaging of the head and neck was performed using the standard protocol during bolus administration of intravenous contrast. Multiplanar CT image reconstructions and MIPs were obtained to evaluate the vascular anatomy. Carotid stenosis measurements (when applicable) are obtained utilizing NASCET criteria, using the distal internal carotid diameter as the denominator. CONTRAST:  75 cc ISOVUE-370 IOPAMIDOL (ISOVUE-370) INJECTION 76% COMPARISON:  CT HEAD 05-21-2018 and MRI head May 07, 2018. FINDINGS: CTA NECK FINDINGS: AORTIC ARCH: Normal appearance of the thoracic arch, normal branch pattern. The origins of the innominate, left Common carotid artery and subclavian artery are patent. RIGHT CAROTID SYSTEM: Common carotid artery is  patent. Normal appearance of the carotid bifurcation without hemodynamically  significant stenosis by NASCET criteria. Normal appearance of the internal carotid artery. LEFT CAROTID SYSTEM: Common carotid artery is patent. Normal appearance of the carotid bifurcation without hemodynamically significant stenosis by NASCET criteria. Normal appearance of the internal carotid artery. VERTEBRAL ARTERIES:LEFT vertebral artery occluded from origin without reconstitution in the neck. Normal RIGHT vertebral artery. SKELETON: No acute osseous process though bone windows have not been submitted. OTHER NECK: Soft tissues of the neck are nonacute though, not tailored for evaluation. UPPER CHEST: Mild heterogeneous lung attenuation seen with small airway disease. CTA HEAD FINDINGS: ANTERIOR CIRCULATION: Patent cervical internal carotid arteries, petrous, cavernous and supra clinoid internal carotid arteries. Patent anterior communicating artery. Patent anterior and middle cerebral arteries. No large vessel occlusion, flow-limiting stenosis, contrast extravasation or aneurysm. 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 Occluded distal RIGHT vertebral artery. Occluded vertebrobasilar junction and basilar artery with collateral vessel within interpeduncular notch. Reconstitution of basilar artery at tip. Robust posterior communicating arteries present. No large vessel occlusion, flow-limiting stenosis, contrast extravasation or aneurysm. VENOUS SINUSES: Major dural venous sinuses are patent though not tailored for evaluation on this angiographic examination. ANATOMIC VARIANTS: None. DELAYED PHASE: Similar nodular enhancement LEFT > RIGHT occipital lobes corresponding to subacute infarcts. MIP images reviewed. IMPRESSION: CTA NECK: 1. 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 Patent RIGHT vertebral artery. 2. Normal bilateral ICA's. CTA HEAD: 1. Occluded LEFT and distal RIGHT vertebral  arteries and vertebrobasilar junction. 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 Acute findings discussed with and reconfirmed by Dr.Kirkpatrick, Neurology on 2018/06/03 at 4:01 am. Electronically Signed   By: Awilda Metro M.D.   On: 03-Jun-2018 04:03   Mr Brain W And Wo Contrast  Result Date: 06-03-18 CLINICAL DATA:  LEFT neck pain for a few days. LEFT arm numbness beginning this afternoon and transient slurred speech. EXAM: MRI HEAD WITHOUT AND WITH CONTRAST MRI CERVICAL SPINE WITHOUT AND WITH CONTRAST TECHNIQUE: Multiplanar, multiecho pulse sequences of the brain and surrounding structures, and cervical spine, to include the craniocervical junction and cervicothoracic junction, were obtained without and with intravenous contrast. CONTRAST:  8 cc Gadavist COMPARISON:  CT HEAD May 06, 2018 FINDINGS: MRI HEAD FINDINGS INTRACRANIAL CONTENTS: Patchy LEFT > RIGHT cerebellar and pontine reduced diffusion with low ADC values and to lesser extent normalized ADC values. Nodular enhancement of LEFT > RIGHT occipital lobes consistent with subacute infarct. Old small RIGHT cerebellar, LEFT splenium of corpus callosum, LEFT periventricular white matter and LEFT occipital infarcts. No midline shift, masses or abnormal extra-axial fluid collections. No parenchymal brain volume loss for age. No hydrocephalus. No abnormal parenchymal or extra-axial enhancement though post gadolinium sequences are moderately motion degraded. VASCULAR: Slightly attenuated LEFT vertebral artery flow void in the neck. Attenuated basilar artery flow void. SKULL AND UPPER CERVICAL SPINE: No abnormal sellar expansion. No suspicious calvarial bone marrow signal. Craniocervical junction maintained. SINUSES/ORBITS: The mastoid air-cells and included paranasal sinuses are well-aerated.The included ocular globes and orbital contents are non-suspicious. OTHER: None. MRI CERVICAL  SPINE FINDINGS ALIGNMENT: Straightened cervical lordosis.  No malalignment. VERTEBRAE/DISCS: Vertebral bodies are intact. Intervertebral disc morphology's and signal are normal. No abnormal or acute bone marrow signal. No abnormal osseous or disc enhancement. CORD:Cervical spinal cord is normal morphology and signal characteristics from the cervicomedullary junction to level of T2-3, the most caudal well visualized level. POSTERIOR FOSSA, VERTEBRAL ARTERIES, PARASPINAL TISSUES: No MR findings of ligamentous injury. Attenuated LEFT vertebral artery flow void. DISC LEVELS: No disc bulge, canal stenosis  nor neural foraminal narrowing. IMPRESSION: MRI head: 1. 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 2. Attenuated LEFT vertebral artery and basilar artery seen with slow flow or occlusion. Recommend CTA HEAD and NECK. MRI cervical spine: 1. Slow flow versus occluded LEFT vertebral artery. 2. Otherwise negative MRI cervical spine with and without contrast. Acute findings discussed with and reconfirmed by Dr.KRISTEN WARD on 05/14/2018 at 3:14 am. Electronically Signed   By: Awilda Metro M.D.   On: 11-May-2018 03:20   Mr Cervical Spine W Or Wo Contrast  Result Date: 05/27/2018 CLINICAL DATA:  LEFT neck pain for a few days. LEFT arm numbness beginning this afternoon and transient slurred speech. EXAM: MRI HEAD WITHOUT AND WITH CONTRAST MRI CERVICAL SPINE WITHOUT AND WITH CONTRAST TECHNIQUE: Multiplanar, multiecho pulse sequences of the brain and surrounding structures, and cervical spine, to include the craniocervical junction and cervicothoracic junction, were obtained without and with intravenous contrast. CONTRAST:  8 cc Gadavist COMPARISON:  CT HEAD May 06, 2018 FINDINGS: MRI HEAD FINDINGS INTRACRANIAL CONTENTS: Patchy LEFT > RIGHT cerebellar and pontine reduced diffusion with low ADC values and to lesser extent normalized ADC values. Nodular enhancement of LEFT >  RIGHT occipital lobes consistent with subacute infarct. Old small RIGHT cerebellar, LEFT splenium of corpus callosum, LEFT periventricular white matter and LEFT occipital infarcts. No midline shift, masses or abnormal extra-axial fluid collections. No parenchymal brain volume loss for age. No hydrocephalus. No abnormal parenchymal or extra-axial enhancement though post gadolinium sequences are moderately motion degraded. VASCULAR: Slightly attenuated LEFT vertebral artery flow void in the neck. Attenuated basilar artery flow void. SKULL AND UPPER CERVICAL SPINE: No abnormal sellar expansion. No suspicious calvarial bone marrow signal. Craniocervical junction maintained. SINUSES/ORBITS: The mastoid air-cells and included paranasal sinuses are well-aerated.The included ocular globes and orbital contents are non-suspicious. OTHER: None. MRI CERVICAL SPINE FINDINGS ALIGNMENT: Straightened cervical lordosis.  No malalignment. VERTEBRAE/DISCS: Vertebral bodies are intact. Intervertebral disc morphology's and signal are normal. No abnormal or acute bone marrow signal. No abnormal osseous or disc enhancement. CORD:Cervical spinal cord is normal morphology and signal characteristics from the cervicomedullary junction to level of T2-3, the most caudal well visualized level. POSTERIOR FOSSA, VERTEBRAL ARTERIES, PARASPINAL TISSUES: No MR findings of ligamentous injury. Attenuated LEFT vertebral artery flow void. DISC LEVELS: No disc bulge, canal stenosis nor neural foraminal narrowing. IMPRESSION: MRI head: 1. 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 2. Attenuated LEFT vertebral artery and basilar artery seen with slow flow or occlusion. Recommend CTA HEAD and NECK. MRI cervical spine: 1. Slow flow versus occluded LEFT vertebral artery. 2. Otherwise negative MRI cervical spine with and without contrast. Acute findings discussed with and reconfirmed by Dr.KRISTEN WARD on 06/03/2018 at  3:14 am. Electronically Signed   By: Awilda Metro M.D.   On: May 11, 2018 03:20   Dg Chest Port 1 View  Result Date: 06/01/2018 CLINICAL DATA:  Stroke EXAM: PORTABLE CHEST 1 VIEW COMPARISON:  None. FINDINGS: Normal heart size and mediastinal contours. No acute infiltrate or edema. No effusion or pneumothorax. No acute osseous findings. IMPRESSION: Negative chest. Electronically Signed   By: Marnee Spring M.D.   On: May 11, 2018 06:26   2D Echocardiogram   1. The left ventricle has normal systolic function with an ejection fraction of 60-65%. The cavity size was normal. Left ventricular diastolic parameters were normal.  2. The right ventricle has normal systolic function. The cavity was normal. There is no increase in right ventricular wall thickness.  3.  Agitated saline exam indeterminate due to image quality, however no large right to left shunt seen.  4. No intracardiac mass or thrombi identified. No intracardiac source of emboli identified.   PHYSICAL EXAM Young Caucasian lady not in distress. . Afebrile. Head is nontraumatic. Neck is supple without bruit.    Cardiac exam no murmur or gallop. Lungs are clear to auscultation. Distal pulses are well felt. Neurological Exam :  Patient is drowsy but can arouse easily. She'll oriented to time place and person and situation. She follows commands well. She has mild dysarthria but can be easily understood. No aphasia or neglect. She has slight left gaze preference but is able to look all the way to the right. No restriction of eye movements in any direction. Saccadic dysmetria on lateral gaze bilaterally with a sustained nystagmus on end gaze. 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 She has mild right lower facial weakness. Left face and hemibody sensory loss. Right hemiparesis with right upper extremity 3/5 right lower extremity 4+/5 strength. Normal strength on the left side. Coordination is impaired in  the right upper and lower extremity. Dependent for 0 symmetric. Plantars downgoing. Gait not tested. ASSESSMENT/PLAN Ms. Guillermina Shaft is a 28 y.o. female with no significant PMH presenting to [facility] with acute onset slurred speech followed by diplopia and L sided numbness.     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 However presence of old cerebellar infarcts raises possibility of occlusive posterior circulation disease with acute on chronic occlusion  CT head patchy B cerebellar L>R hypodensity  MRI head multifocial acute, subacute and chornic posterior circulation infarcts. Attenuated L VA and BA w/ slow flow, occlusion  MRI CS L VA slow flow vs occlusion, o/w neg  CTA head occluded L and distal R VA and VBJ. Occluded BA w/ reconstitution basilar tip and complete COW.  CTA neck occluded L VA. Patent R VA. Normal ICAs  2D Echo bubble EF 60-65%. No source of embolus. Neg bubble  LDL 179  HgbA1c 5.1  IV hperain for VTE prophylaxis Diet Order            Diet NPO time specified  Diet effective now            for swallow eval today   No antithrombotic prior to admission, now on heparin IV. Add aspirin now. Load with plavix 300 followed by 75 mg daily once able to swallow.   Therapy recommendations:  pending   Disposition:  pending   Hyperlipidemia  Home meds:  No statin  LDL 179, goal < 70  Add statin - lipitor 80  Continue statin at discharge  Other Stroke Risk Factors  ETOH use, advised to drink no more than 1 drink(s) a day  Overweight  Body mass index is 28.53 kg/m., recommend weight loss, diet and exercise as appropriate   Other Active Problems  Anxiety/depression on zoloft  OCD on rexulti  CXR neg  Hospital day # 0  I have personally obtained history,examined this patient, reviewed notes, independently viewed imaging studies, participated in medical decision  making and plan of care.ROS completed by me personally and pertinent positives fully documented  I have made any additions or clarifications directly to the above note.   patient has presented with subacute story of headache neck pain followed by worsening dysarthria, gait ataxia, numbness and diplopia  due to subacute and acute brainstem and cerebellar infarcts likely from left vertebral artery occlusion as well as basilar artery occlusion. Etiology unclear as to dissection given her young age versus occlusive posterior circulation disease given presence of old infarcts noted on MRI as well. Patient remains at significant risk for neurological worsening. Agree with anticoagulation with IV heparin for short-term but if she has ongoing neurological worsening may need diagnostic cerebral catheter angiogram to see if her basilar artery occlusion is amenable to endovascular treatment. Long discussion with the patient as well as her mother at the bedside and Dr. Corliss Skains and everybody is in agreement with the plan. Continue IV heparin for now an IV hydration and if patient remains stable and able to swallow   will add aspirin and Plavix. This patient is critically ill and at significant risk of neurological worsening, death and care requires constant monitoring of vital signs, hemodynamics,respiratory and cardiac monitoring, extensive review of multiple databases, frequent neurological assessment, discussion with family, other specialists and medical decision making of high complexity.I have made any additions or clarifications directly to the above note.This critical care time does not reflect procedure time, or teaching time or supervisory time of PA/NP/Med Resident etc but could involve care discussion time.  I spent 50 minutes of neurocritical care time  in the care of  this patient.     Delia Heady, MD Medical Director [facility] Pager: [phone] 10-May-2018 1:57 PM   To contact Stroke  Continuity provider, please refer to [email or web address]. After hours, contact General Neurology

## 2018-05-07 NOTE — Progress Notes (Signed)
Chaplain responded to spiritual consult.  Patient was in a procedure.  Her  Mother was let in and is waiting.  Will continue to follow. Lynnell Chad Pager [phone]

## 2018-05-07 NOTE — Sedation Documentation (Signed)
Writer is assuming RN position at this time. Pt is under general anesthesia and under the care of anesthesia at this time

## 2018-05-07 NOTE — ED Notes (Signed)
Patient mother calling Erika Hernandez [phone] asking for an update

## 2018-05-07 NOTE — Progress Notes (Signed)
Anesthesiology Note:  Erika Hernandez is a 28 year old female with a history of of multifocal acute, subacute and chronic posterior circulation nonhemorrhagic infarcts.  She underwent interventional neuroradiologic revascularization of her left vertebral and proximal basilar artery by Dr. Estanislado Pandy.  Prior to the procedure the patient was lethargic but following simple commands with right-sided weakness and some ability to squeeze her left hand.  Following the procedure, which was done under general endotracheal anesthesia, she opened her eyes to commands and was extubated and brought to the recovery room.  However over the following 30  to 45 minutes in the recovery room she became progressively more obtunded. She was initially opened her eyes to commands and was moving her left arm to commands but became progressively more lethargic and tachypneic and her oxygen saturations decreased from 97 to 91%.  The decision was then made to reintubate her for airway protection.  The patient was then preoxygenated with 100% oxygen and bag mask ventilation.  She was given 160 mg of propofol and 120 mg of succinylcholine.  DL x1 with a MAC 3 blade by Iona Beard, CRNA.  A grade 1 view of the larynx was noted and a 7.0 glottic suction tube was inserted without difficulty.  The patient was then placed on a propofol infusion and will be transferred to 4 N. neuro ICU.  Vital signs following intubation:  Blood pressure 125/85 HR 82 O2 sat 99% with bag ventilation.  The patient's mother was then informed by telephone that the patient was reintubated all questions were answered.  Roberts Gaudy, MD

## 2018-05-07 NOTE — Progress Notes (Signed)
eLink Physician-Brief Progress Note Patient Name: Erika Hernandez DOB: 12-01-1990 MRN: [id number]   Date of Service  Jun 06, 2018  HPI/Events of Note  Hypertension - Patient is on Cleviprex for SBP 120-140 control. Cleviprex ordered D/Ced by PCCM.   eICU Interventions  Will order. 1. Cleviprex IV infusion. Titrate SBP to goal 120-140.     Intervention Category Major Interventions: Hypertension - evaluation and management  Kalina Morabito Eugene 06/06/18, 7:23 PM

## 2018-05-07 NOTE — ED Notes (Signed)
ED TO INPATIENT HANDOFF REPORT  ED Nurse Name and Phone #: Romeo Apple [phone]  S Name/Age/Gender Erika Hernandez 28 y.o. female Room/Bed: 017C/017C  Code Status   Code Status: Full Code  Home/SNF/Other Home Patient oriented to: self, place, time and situation Is this baseline? Yes   Triage Complete: Triage complete  Chief Complaint left side of neck/body pain.Marland Kitchenslurred speach  Triage Note Patient here with c/o left leg numbness and weakness after walking at home around 1430 today, her mom then called her and stated her speech was slurred. HA 4/10. Patient also mentioned she had left neck pain for the past 3 days. Patient aox4.    Allergies No Known Allergies  Level of Care/Admitting Diagnosis ED Disposition    ED Disposition Condition Comment   Admit  Hospital Area: [facility] [[id number]]  Level of Care: ICU [6]  Diagnosis: Stroke (cerebrum) [facility]) [[id number]]  Admitting Physician: Amada Jupiter, MCNEILL P [[id number]]  Attending Physician: Amada Jupiter, MCNEILL P [[id number]]  Estimated length of stay: past midnight tomorrow  Certification:: I certify this patient will need inpatient services for at least 2 midnights  PT Class (Do Not Modify): Inpatient [101]  PT Acc Code (Do Not Modify): Private [1]       B Medical/Surgery History Past Medical History:  Diagnosis Date  . Asthma   . GAD (generalized anxiety disorder)   . Kidney stone   . Major depressive disorder   . Mixed obsessional thoughts and acts   . OCD (obsessive compulsive disorder)    Past Surgical History:  Procedure Laterality Date  . ARTHROSCOPIC REPAIR ACL       A IV Location/Drains/Wounds Patient Lines/Drains/Airways Status   Active Line/Drains/Airways    Name:   Placement date:   Placement time:   Site:   Days:   Peripheral IV 04/13/2018 Right Antecubital   04/10/2018    2109    Antecubital   1   Peripheral IV 13-May-2018 Left Forearm   13-May-2018    0444    Forearm   less than 1           Intake/Output Last 24 hours No intake or output data in the 24 hours ending 05/13/18 0453  Labs/Imaging Results for orders placed or performed during the hospital encounter of 04/20/2018 (from the past 48 hour(s))  CBG monitoring, ED     Status: None   Collection Time: 04/22/2018  8:39 PM  Result Value Ref Range   Glucose-Capillary 97 70 - 99 mg/dL  CBC with Differential/Platelet     Status: None   Collection Time: 05/03/2018  8:52 PM  Result Value Ref Range   WBC 9.2 4.0 - 10.5 K/uL   RBC 4.85 3.87 - 5.11 MIL/uL   Hemoglobin 14.5 12.0 - 15.0 g/dL   HCT 47.8 29.5 - 62.1 %   MCV 93.2 80.0 - 100.0 fL   MCH 29.9 26.0 - 34.0 pg   MCHC 32.1 30.0 - 36.0 g/dL   RDW 30.8 65.7 - 84.6 %   Platelets 231 150 - 400 K/uL   nRBC 0.0 0.0 - 0.2 %   Neutrophils Relative % 64 %   Neutro Abs 5.9 1.7 - 7.7 K/uL   Lymphocytes Relative 24 %   Lymphs Abs 2.2 0.7 - 4.0 K/uL   Monocytes Relative 9 %   Monocytes Absolute 0.8 0.1 - 1.0 K/uL   Eosinophils Relative 3 %   Eosinophils Absolute 0.3 0.0 - 0.5 K/uL   Basophils Relative 0 %  Basophils Absolute 0.0 0.0 - 0.1 K/uL   Immature Granulocytes 0 %   Abs Immature Granulocytes 0.01 0.00 - 0.07 K/uL    Comment: Performed at [facility], [address] [place], [place] [address]  Comprehensive metabolic panel     Status: Abnormal   Collection Time: 04/13/2018  8:52 PM  Result Value Ref Range   Sodium 136 135 - 145 mmol/L   Potassium 3.7 3.5 - 5.1 mmol/L   Chloride 107 98 - 111 mmol/L   CO2 22 22 - 32 mmol/L   Glucose, Bld 110 (H) 70 - 99 mg/dL   BUN 12 6 - 20 mg/dL   Creatinine, Ser 6.04 0.44 - 1.00 mg/dL   Calcium 8.9 8.9 - 54.0 mg/dL   Total Protein 6.6 6.5 - 8.1 g/dL   Albumin 3.2 (L) 3.5 - 5.0 g/dL   AST 22 15 - 41 U/L   ALT 16 0 - 44 U/L   Alkaline Phosphatase 80 38 - 126 U/L   Total Bilirubin 0.5 0.3 - 1.2 mg/dL   GFR calc non Af Amer >60 >60 mL/min   GFR calc Af Amer >60 >60 mL/min   Anion gap 7 5 - 15    Comment: Performed  at [facility], [address] [place], [place] [address]  I-Stat Beta hCG blood, ED ([facility], [facility], [facility] only)     Status: None   Collection Time: 04/12/2018 11:39 PM  Result Value Ref Range   I-stat hCG, quantitative <5.0 <5 mIU/mL   Comment 3            Comment:   GEST. AGE      CONC.  (mIU/mL)   <=1 WEEK        5 - 50     2 WEEKS       50 - 500     3 WEEKS       100 - 10,000     4 WEEKS     1,000 - 30,000        FEMALE AND NON-PREGNANT FEMALE:     LESS THAN 5 mIU/mL    Ct Angio Head W Or Wo Contrast  Result Date: 04/17/2018 CLINICAL DATA:  LEFT-sided numbness this and neck pain. Follow-up infarcts. EXAM: CT ANGIOGRAPHY HEAD AND NECK TECHNIQUE: Multidetector CT imaging of the head and neck was performed using the standard protocol during bolus administration of intravenous contrast. Multiplanar CT image reconstructions and MIPs were obtained to evaluate the vascular anatomy. Carotid stenosis measurements (when applicable) are obtained utilizing NASCET criteria, using the distal internal carotid diameter as the denominator. CONTRAST:  75 cc ISOVUE-370 IOPAMIDOL (ISOVUE-370) INJECTION 76% COMPARISON:  CT HEAD May 06, 2018 and MRI head May 07, 2018. FINDINGS: CTA NECK FINDINGS: AORTIC ARCH: Normal appearance of the thoracic arch, normal branch pattern. The origins of the innominate, left Common carotid artery and subclavian artery are patent. RIGHT CAROTID SYSTEM: Common carotid artery is patent. Normal appearance of the carotid bifurcation without hemodynamically significant stenosis by NASCET criteria. Normal appearance of the internal carotid artery. LEFT CAROTID SYSTEM: Common carotid artery is patent. Normal appearance of the carotid bifurcation without hemodynamically significant stenosis by NASCET criteria. Normal appearance of the internal carotid artery. VERTEBRAL ARTERIES:LEFT vertebral artery occluded from origin without reconstitution in the neck. Normal RIGHT vertebral artery.  SKELETON: No acute osseous process though bone windows have not been submitted. OTHER NECK: Soft tissues of the neck are nonacute though, not  tailored for evaluation. UPPER CHEST: Mild heterogeneous lung attenuation seen with small airway disease. CTA HEAD FINDINGS: ANTERIOR CIRCULATION: Patent cervical internal carotid arteries, petrous, cavernous and supra clinoid internal carotid arteries. Patent anterior communicating artery. Patent anterior and middle cerebral arteries. No large vessel occlusion, flow-limiting stenosis, contrast extravasation or aneurysm. 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 Occluded distal RIGHT vertebral artery. Occluded vertebrobasilar junction and basilar artery with collateral vessel within interpeduncular notch. Reconstitution of basilar artery at tip. Robust posterior communicating arteries present. No large vessel occlusion, flow-limiting stenosis, contrast extravasation or aneurysm. VENOUS SINUSES: Major dural venous sinuses are patent though not tailored for evaluation on this angiographic examination. ANATOMIC VARIANTS: None. DELAYED PHASE: Similar nodular enhancement LEFT > RIGHT occipital lobes corresponding to subacute infarcts. MIP images reviewed. IMPRESSION: CTA NECK: 1. 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 Patent RIGHT vertebral artery. 2. Normal bilateral ICA's. CTA HEAD: 1. 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 Acute findings discussed with and reconfirmed by Dr.Kirkpatrick, Neurology on 05/02/2018 at 4:01 am. Electronically Signed   By: Awilda Metroourtnay  Bloomer M.D.   On: 04/16/2018 04:03   Ct Head Wo Contrast  Result Date: 2018/06/19 CLINICAL DATA:  28 year old female with left leg numbness and weakness. EXAM: CT HEAD WITHOUT CONTRAST TECHNIQUE: Contiguous axial images  were obtained from the base of the skull through the vertex without intravenous contrast. COMPARISON:  None. FINDINGS: Brain: The ventricles and sulci appropriate size for patient's age. Patchy areas of hypodensity in the cerebellar hemispheres bilaterally, left greater right, most consistent with areas of infarct of indeterminate age. Acute infarct is not excluded. Clinical correlation is recommended. If there is clinical concern for acute infarct further evaluation with MRI is recommended. A somewhat linear area hypodensity in the right cerebellar hemisphere likely represents an old infarct. There is no acute intracranial hemorrhage. No mass effect or midline shift. No extra-axial fluid collection. Vascular: No hyperdense vessel or unexpected calcification. Skull: Normal. Negative for fracture or focal lesion. Sinuses/Orbits: No acute finding. Other: None IMPRESSION: 1. No acute intracranial hemorrhage. 2. Patchy areas of hypodensity in the cerebellar hemispheres bilaterally, left greater right, most consistent with areas of infarct of indeterminate age. Acute infarct is not excluded. Clinical correlation is recommended. If there is clinical concern for acute infarct further evaluation with MRI recommended. These results were called by telephone at the time of interpretation on 2018/06/19 at 9:25 pm to Dr. Loren RacerAVID YELVERTON , who verbally acknowledged these results. Electronically Signed   By: Elgie CollardArash  Radparvar M.D.   On: 02020/05/13 21:31   Ct Angio Neck W And/or Wo Contrast  Result Date: 04/15/2018 CLINICAL DATA:  LEFT-sided numbness this and neck pain. Follow-up infarcts. EXAM: CT ANGIOGRAPHY HEAD AND NECK TECHNIQUE: Multidetector CT imaging of the head and neck was performed using the standard protocol during bolus administration of intravenous contrast. Multiplanar CT image reconstructions and MIPs were obtained to evaluate the vascular anatomy. Carotid stenosis measurements (when applicable) are obtained  utilizing NASCET criteria, using the distal internal carotid diameter as the denominator. CONTRAST:  75 cc ISOVUE-370 IOPAMIDOL (ISOVUE-370) INJECTION 76% COMPARISON:  CT HEAD May 06, 2018 and MRI head May 07, 2018. FINDINGS: CTA NECK FINDINGS: AORTIC ARCH: Normal appearance of the thoracic arch, normal branch pattern. The origins of the innominate, left Common carotid artery and subclavian artery are patent. RIGHT CAROTID SYSTEM: Common carotid artery is patent. Normal appearance of the carotid  bifurcation without hemodynamically significant stenosis by NASCET criteria. Normal appearance of the internal carotid artery. LEFT CAROTID SYSTEM: Common carotid artery is patent. Normal appearance of the carotid bifurcation without hemodynamically significant stenosis by NASCET criteria. Normal appearance of the internal carotid artery. VERTEBRAL ARTERIES:LEFT vertebral artery occluded from origin without reconstitution in the neck. Normal RIGHT vertebral artery. SKELETON: No acute osseous process though bone windows have not been submitted. OTHER NECK: Soft tissues of the neck are nonacute though, not tailored for evaluation. UPPER CHEST: Mild heterogeneous lung attenuation seen with small airway disease. CTA HEAD FINDINGS: ANTERIOR CIRCULATION: Patent cervical internal carotid arteries, petrous, cavernous and supra clinoid internal carotid arteries. Patent anterior communicating artery. Patent anterior and middle cerebral arteries. No large vessel occlusion, flow-limiting stenosis, contrast extravasation or aneurysm. 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 Occluded distal RIGHT vertebral artery. Occluded vertebrobasilar junction and basilar artery with collateral vessel within interpeduncular notch. Reconstitution of basilar artery at tip. Robust posterior communicating arteries present. No large vessel occlusion, flow-limiting stenosis, contrast extravasation or aneurysm. VENOUS  SINUSES: Major dural venous sinuses are patent though not tailored for evaluation on this angiographic examination. ANATOMIC VARIANTS: None. DELAYED PHASE: Similar nodular enhancement LEFT > RIGHT occipital lobes corresponding to subacute infarcts. MIP images reviewed. IMPRESSION: CTA NECK: 1. 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 Patent RIGHT vertebral artery. 2. Normal bilateral ICA's. CTA HEAD: 1. 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 Acute findings discussed with and reconfirmed by Dr.Kirkpatrick, Neurology on 04/30/2018 at 4:01 am. Electronically Signed   By: Awilda Metro M.D.   On: 04/22/2018 04:03   Mr Brain W And Wo Contrast  Result Date: 04/22/2018 CLINICAL DATA:  LEFT neck pain for a few days. LEFT arm numbness beginning this afternoon and transient slurred speech. EXAM: MRI HEAD WITHOUT AND WITH CONTRAST MRI CERVICAL SPINE WITHOUT AND WITH CONTRAST TECHNIQUE: Multiplanar, multiecho pulse sequences of the brain and surrounding structures, and cervical spine, to include the craniocervical junction and cervicothoracic junction, were obtained without and with intravenous contrast. CONTRAST:  8 cc Gadavist COMPARISON:  CT HEAD 27-May-2018 FINDINGS: MRI HEAD FINDINGS INTRACRANIAL CONTENTS: Patchy LEFT > RIGHT cerebellar and pontine reduced diffusion with low ADC values and to lesser extent normalized ADC values. Nodular enhancement of LEFT > RIGHT occipital lobes consistent with subacute infarct. Old small RIGHT cerebellar, LEFT splenium of corpus callosum, LEFT periventricular white matter and LEFT occipital infarcts. No midline shift, masses or abnormal extra-axial fluid collections. No parenchymal brain volume loss for age. No hydrocephalus. No abnormal parenchymal or extra-axial enhancement though post gadolinium sequences are  moderately motion degraded. VASCULAR: Slightly attenuated LEFT vertebral artery flow void in the neck. Attenuated basilar artery flow void. SKULL AND UPPER CERVICAL SPINE: No abnormal sellar expansion. No suspicious calvarial bone marrow signal. Craniocervical junction maintained. SINUSES/ORBITS: The mastoid air-cells and included paranasal sinuses are well-aerated.The included ocular globes and orbital contents are non-suspicious. OTHER: None. MRI CERVICAL SPINE FINDINGS ALIGNMENT: Straightened cervical lordosis.  No malalignment. VERTEBRAE/DISCS: Vertebral bodies are intact. Intervertebral disc morphology's and signal are normal. No abnormal or acute bone marrow signal. No abnormal osseous or disc enhancement. CORD:Cervical spinal cord is normal morphology and signal characteristics from the cervicomedullary junction to level of T2-3, the most caudal well visualized level. POSTERIOR FOSSA, VERTEBRAL ARTERIES, PARASPINAL TISSUES: No MR findings of ligamentous injury. Attenuated LEFT vertebral artery flow void. DISC LEVELS: No disc  bulge, canal stenosis nor neural foraminal narrowing. IMPRESSION: MRI head: 1. 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 2. Attenuated LEFT vertebral artery and basilar artery seen with slow flow or occlusion. Recommend CTA HEAD and NECK. MRI cervical spine: 1. Slow flow versus occluded LEFT vertebral artery. 2. Otherwise negative MRI cervical spine with and without contrast. Acute findings discussed with and reconfirmed by Dr.KRISTEN WARD on 04/18/2018 at 3:14 am. Electronically Signed   By: Awilda Metro M.D.   On: 04/08/2018 03:20   Mr Cervical Spine W Or Wo Contrast  Result Date: 04/21/2018 CLINICAL DATA:  LEFT neck pain for a few days. LEFT arm numbness beginning this afternoon and transient slurred speech. EXAM: MRI HEAD WITHOUT AND WITH CONTRAST MRI CERVICAL SPINE WITHOUT AND WITH CONTRAST TECHNIQUE: Multiplanar, multiecho pulse  sequences of the brain and surrounding structures, and cervical spine, to include the craniocervical junction and cervicothoracic junction, were obtained without and with intravenous contrast. CONTRAST:  8 cc Gadavist COMPARISON:  CT HEAD 2018-06-01 FINDINGS: MRI HEAD FINDINGS INTRACRANIAL CONTENTS: Patchy LEFT > RIGHT cerebellar and pontine reduced diffusion with low ADC values and to lesser extent normalized ADC values. Nodular enhancement of LEFT > RIGHT occipital lobes consistent with subacute infarct. Old small RIGHT cerebellar, LEFT splenium of corpus callosum, LEFT periventricular white matter and LEFT occipital infarcts. No midline shift, masses or abnormal extra-axial fluid collections. No parenchymal brain volume loss for age. No hydrocephalus. No abnormal parenchymal or extra-axial enhancement though post gadolinium sequences are moderately motion degraded. VASCULAR: Slightly attenuated LEFT vertebral artery flow void in the neck. Attenuated basilar artery flow void. SKULL AND UPPER CERVICAL SPINE: No abnormal sellar expansion. No suspicious calvarial bone marrow signal. Craniocervical junction maintained. SINUSES/ORBITS: The mastoid air-cells and included paranasal sinuses are well-aerated.The included ocular globes and orbital contents are non-suspicious. OTHER: None. MRI CERVICAL SPINE FINDINGS ALIGNMENT: Straightened cervical lordosis.  No malalignment. VERTEBRAE/DISCS: Vertebral bodies are intact. Intervertebral disc morphology's and signal are normal. No abnormal or acute bone marrow signal. No abnormal osseous or disc enhancement. CORD:Cervical spinal cord is normal morphology and signal characteristics from the cervicomedullary junction to level of T2-3, the most caudal well visualized level. POSTERIOR FOSSA, VERTEBRAL ARTERIES, PARASPINAL TISSUES: No MR findings of ligamentous injury. Attenuated LEFT vertebral artery flow void. DISC LEVELS: No disc bulge, canal stenosis nor neural foraminal  narrowing. IMPRESSION: MRI head: 1. 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 2. Attenuated LEFT vertebral artery and basilar artery seen with slow flow or occlusion. Recommend CTA HEAD and NECK. MRI cervical spine: 1. Slow flow versus occluded LEFT vertebral artery. 2. Otherwise negative MRI cervical spine with and without contrast. Acute findings discussed with and reconfirmed by Dr.KRISTEN WARD on 04/21/2018 at 3:14 am. Electronically Signed   By: Awilda Metro M.D.   On: 04/23/2018 03:20    Pending Labs Unresulted Labs (From admission, onward)    Start     Ordered   05/08/18 0500  Heparin level (unfractionated)  Daily,   R     04/17/2018 0439   05/08/18 0500  CBC  Daily,   R     04/30/2018 0439   04/25/2018 1200  Heparin level (unfractionated)  Once-Timed,   R     04/24/2018 0439   05/03/2018 0500  Hemoglobin A1c  Tomorrow morning,   R     05/01/2018 0441   04/28/2018 0500  Lipid panel  Tomorrow morning,   R    Comments:  Fasting  04/15/2018 0441   04/09/2018 0436  HIV antibody (Routine Testing)  Once,   R     04/19/2018 0441          Vitals/Pain Today's Vitals   04/15/2018 0411 04/11/2018 0415 04/18/2018 0420 04/18/2018 0440  BP:  126/79 123/71 124/79  Pulse: 73 64 61 65  Resp: 15 19 18 20   Temp:      TempSrc:      SpO2: 92% 97% 96% 97%  Weight:      Height:      PainSc:        Isolation Precautions No active isolations  Medications Medications  heparin ADULT infusion 100 units/mL (25000 units/245mL sodium chloride 0.45%) (1,200 Units/hr Intravenous New Bag/Given 04/13/2018 0448)   stroke: mapping our early stages of recovery book (has no administration in time range)  acetaminophen (TYLENOL) tablet 650 mg (has no administration in time range)    Or  acetaminophen (TYLENOL) solution 650 mg (has no administration in time range)    Or  acetaminophen (TYLENOL) suppository 650 mg (has no administration in time range)  gadobutrol (GADAVIST) 1  MMOL/ML injection 8 mL (8 mLs Intravenous Contrast Given 04/25/2018 0258)  iopamidol (ISOVUE-370) 76 % injection 75 mL (75 mLs Intravenous Contrast Given 04/15/2018 0337)    Mobility non-ambulatory Low fall risk   Focused Assessments Neuro Assessment Handoff:  Swallow screen pass? Needs to be completed again   NIH Stroke Scale ( + Modified Stroke Scale Criteria)  Interval: Neuro change Level of Consciousness (1a.)   : Alert, keenly responsive LOC Questions (1b. )   +: Answers both questions correctly LOC Commands (1c. )   + : Performs both tasks correctly Best Gaze (2. )  +: Partial gaze palsy Visual (3. )  +: Partial hemianopia Facial Palsy (4. )    : Partial paralysis  Motor Arm, Left (5a. )   +: Drift Motor Arm, Right (5b. )   +: No drift Motor Leg, Left (6a. )   +: Drift Motor Leg, Right (6b. )   +: No drift Limb Ataxia (7. ): Absent Sensory (8. )   +: Mild-to-moderate sensory loss, patient feels pinprick is less sharp or is dull on the affected side, or there is a loss of superficial pain with pinprick, but patient is aware of being touched Best Language (9. )   +: Mild-to-moderate aphasia Dysarthria (10. ): Normal Extinction/Inattention (11.)   +: No Abnormality Modified SS Total  +: 6 Complete NIHSS TOTAL: 8     Neuro Assessment: Exceptions to WDL Neuro Checks:   Neuro change (04/28/2018 0411)  Last Documented NIHSS Modified Score: 6 (04/20/2018 0442) Has TPA been given? No If patient is a Neuro Trauma and patient is going to OR before floor call report to 4N Charge nurse: [phone] or [phone]     R Recommendations: See Admitting Provider Note  Report given to:   Additional Notes: N/A

## 2018-05-07 NOTE — Anesthesia Procedure Notes (Signed)
Procedure Name: Intubation Date/Time: 04/14/2018 12:18 PM Performed by: Oletta Lamas, CRNA Pre-anesthesia Checklist: Patient identified, Emergency Drugs available, Suction available and Patient being monitored Patient Re-evaluated:Patient Re-evaluated prior to induction Oxygen Delivery Method: Circle System Utilized Preoxygenation: Pre-oxygenation with 100% oxygen Induction Type: IV induction and Rapid sequence Laryngoscope Size: Mac and 3 Grade View: Grade I Tube type: Oral Number of attempts: 1 Airway Equipment and Method: Stylet Placement Confirmation: ETT inserted through vocal cords under direct vision,  positive ETCO2 and breath sounds checked- equal and bilateral Secured at: 22 cm Tube secured with: Tape Dental Injury: Teeth and Oropharynx as per pre-operative assessment
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
## 2018-05-07 NOTE — Progress Notes (Signed)
I called pt's cell phone to have appt. I spoke w/ patient's Mom, Sue Lush.  Pt has had a stroke.  Is in surgery right now.

## 2018-05-07 NOTE — Progress Notes (Signed)
SLP Cancellation Note  Patient Details Name: Erika Hernandez MRN: [id number] DOB: [birth year]   Cancelled treatment:       Reason Eval/Treat Not Completed: Patient at procedure or test/unavailable. Pt is currently with Dr. Pearlean Brownie and Asher Muir, RN indicated that the pt will be going down to an IR procedure after that. SLP will follow up.   Mikiah Demond I. Vear Clock, MS, CCC-SLP Acute Rehabilitation Services Office number [phone] Pager [phone]  Scheryl Marten 05/05/2018, 9:17 AM

## 2018-05-07 NOTE — Progress Notes (Signed)
Patient ID: Erika Hernandez, female   DOB: April 11, 1990, 28 y.o.   MRN: [id number]   Change in exam on floor Now in IR for urgent procedure Cerebral arteriogram with possible cerebral artery intervention  Risks and benefits of cerebral angiogram with intervention were discussed with the patient and mother including, but not limited to bleeding, infection, vascular injury, contrast induced renal failure, stroke or even death.  This interventional procedure involves the use of X-rays and because of the nature of the planned procedure, it is possible that we will have prolonged use of X-ray fluoroscopy.  Potential radiation risks to you include (but are not limited to) the following: - A slightly elevated risk for cancer  several years later in life. This risk is typically less than 0.5% percent. This risk is low in comparison to the normal incidence of human cancer, which is 33% for women and 50% for men according to the [employer] Society. - Radiation induced injury can include skin redness, resembling a rash, tissue breakdown / ulcers and hair loss (which can be temporary or permanent).   The likelihood of either of these occurring depends on the difficulty of the procedure and whether you are sensitive to radiation due to previous procedures, disease, or genetic conditions.   IF your procedure requires a prolonged use of radiation, you will be notified and given written instructions for further action.  It is your responsibility to monitor the irradiated area for the 2 weeks following the procedure and to notify your physician if you are concerned that you have suffered a radiation induced injury.    All of the patient's questions were answered, patient is agreeable to proceed.  Consent signed and in chart.

## 2018-05-07 NOTE — ED Notes (Addendum)
Neurologist bedside.

## 2018-05-08 ENCOUNTER — Inpatient Hospital Stay (HOSPITAL_COMMUNITY): Payer: BLUE CROSS/BLUE SHIELD

## 2018-05-08 ENCOUNTER — Encounter (HOSPITAL_COMMUNITY): Payer: Self-pay | Admitting: Interventional Radiology

## 2018-05-08 DIAGNOSIS — J9601 Acute respiratory failure with hypoxia: Secondary | ICD-10-CM

## 2018-05-08 LAB — GLUCOSE, CAPILLARY
Glucose-Capillary: 100 mg/dL — ABNORMAL HIGH (ref 70–99)
Glucose-Capillary: 105 mg/dL — ABNORMAL HIGH (ref 70–99)
Glucose-Capillary: 121 mg/dL — ABNORMAL HIGH (ref 70–99)
Glucose-Capillary: 94 mg/dL (ref 70–99)
Glucose-Capillary: 97 mg/dL (ref 70–99)

## 2018-05-08 LAB — CBC WITH DIFFERENTIAL/PLATELET
Abs Immature Granulocytes: 0.04 10*3/uL (ref 0.00–0.07)
Basophils Absolute: 0 10*3/uL (ref 0.0–0.1)
Basophils Relative: 0 %
Eosinophils Absolute: 0 10*3/uL (ref 0.0–0.5)
Eosinophils Relative: 0 %
HEMATOCRIT: 41.1 % (ref 36.0–46.0)
Hemoglobin: 12.9 g/dL (ref 12.0–15.0)
Immature Granulocytes: 0 %
Lymphocytes Relative: 8 %
Lymphs Abs: 1 10*3/uL (ref 0.7–4.0)
MCH: 29.3 pg (ref 26.0–34.0)
MCHC: 31.4 g/dL (ref 30.0–36.0)
MCV: 93.4 fL (ref 80.0–100.0)
MONO ABS: 1 10*3/uL (ref 0.1–1.0)
Monocytes Relative: 8 %
Neutro Abs: 10.5 10*3/uL — ABNORMAL HIGH (ref 1.7–7.7)
Neutrophils Relative %: 84 %
Platelets: 217 10*3/uL (ref 150–400)
RBC: 4.4 MIL/uL (ref 3.87–5.11)
RDW: 12.5 % (ref 11.5–15.5)
WBC: 12.6 10*3/uL — ABNORMAL HIGH (ref 4.0–10.5)
nRBC: 0 % (ref 0.0–0.2)

## 2018-05-08 LAB — PLATELET INHIBITION P2Y12: Platelet Function  P2Y12: 142 [PRU] — ABNORMAL LOW (ref 182–335)

## 2018-05-08 LAB — BASIC METABOLIC PANEL
Anion gap: 9 (ref 5–15)
BUN: <5 mg/dL — ABNORMAL LOW (ref 6–20)
CO2: 22 mmol/L (ref 22–32)
Calcium: 8.4 mg/dL — ABNORMAL LOW (ref 8.9–10.3)
Chloride: 106 mmol/L (ref 98–111)
Creatinine, Ser: 0.62 mg/dL (ref 0.44–1.00)
GFR calc Af Amer: >60 mL/min (ref >60)
GFR calc non Af Amer: >60 mL/min (ref >60)
GLUCOSE: 117 mg/dL — AB (ref 70–99)
Potassium: 3.5 mmol/L (ref 3.5–5.1)
Sodium: 137 mmol/L (ref 135–145)

## 2018-05-08 LAB — MAGNESIUM: Magnesium: 1.6 mg/dL — ABNORMAL LOW (ref 1.7–2.4)

## 2018-05-08 MED ORDER — TICAGRELOR 90 MG PO TABS: 90.0000 mg | ORAL_TABLET | Freq: Two times a day (BID) | ORAL | Status: DC

## 2018-05-08 MED ORDER — ATORVASTATIN CALCIUM 80 MG PO TABS
80.0000 mg | ORAL_TABLET | Freq: Every day | ORAL | Status: DC
  Administered 2018-05-08 – 2018-05-10 (×3): 80 mg
  Filled 2018-05-08 (×3): qty 1

## 2018-05-08 MED ORDER — CHLORHEXIDINE GLUCONATE 0.12% ORAL RINSE (MEDLINE KIT)
15.0000 mL | Freq: Two times a day (BID) | OROMUCOSAL | Status: DC
  Administered 2018-05-09 – 2018-05-11 (×5): 15 mL via OROMUCOSAL

## 2018-05-08 MED ORDER — TICAGRELOR 90 MG PO TABS
90.0000 mg | ORAL_TABLET | Freq: Two times a day (BID) | ORAL | Status: DC
  Administered 2018-05-08 – 2018-05-10 (×5): 90 mg
  Filled 2018-05-08 (×7): qty 1

## 2018-05-08 MED ORDER — BREXPIPRAZOLE 2 MG PO TABS
2.0000 mg | ORAL_TABLET | Freq: Every day | ORAL | Status: DC
  Administered 2018-05-09 – 2018-05-10 (×2): 2 mg
  Filled 2018-05-08 (×4): qty 1

## 2018-05-08 MED ORDER — SERTRALINE HCL 50 MG PO TABS
300.0000 mg | ORAL_TABLET | Freq: Every day | ORAL | Status: DC
  Administered 2018-05-09 – 2018-05-10 (×2): 300 mg
  Filled 2018-05-08: qty 6

## 2018-05-08 MED ORDER — ORAL CARE MOUTH RINSE
15.0000 mL | OROMUCOSAL | Status: DC
  Administered 2018-05-08 – 2018-05-11 (×26): 15 mL via OROMUCOSAL

## 2018-05-08 NOTE — Progress Notes (Signed)
RT NOTE:  Pt was placed back on previous settings. Pt's RR was increasing and maintaining within the 40s. Rt's vital are stable at this time. RT will continue to monitor.

## 2018-05-08 NOTE — Progress Notes (Signed)
Patient ID: Erika Hernandez, female   DOB: Oct 19, 1990, 28 y.o.   MRN: [id number]   IR round note via phone-- new regulations  Basilar artery revascularization /stent in IR 3/31  Per RN: pt still intubated "staring" straight ahead Left gaze is gone On ASA and Brilinta  Seems to move to command -- sometimes-- does withdraw to pain All 4s moving-- right weaker than left 132/81 HR 94 Afeb  Rt groin No bleeding; no hematoma Rt foot 2+ pulses  Plan per Stroke team Will report to Dr Corliss Skains Will follow
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
## 2018-05-08 NOTE — Evaluation (Addendum)
Occupational Therapy Evaluation Patient Details Name: Erika Hernandez MRN: [id number] DOB: May 07, 1990 Today's Date: 05/08/2018    History of Present Illness 28 year old Caucasian lady with no significant past neurological history with subacute headache neck pain as well as worsening neurlogical symptoms of headache, gait ataxia, slurred speech, numbness  and diplopia. She was started on IV heparin and has remained stable overnight. 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  She denies any significant head injury, motor vehicle accident, repetitive neck jerking movements, chiropractic manipulation to suggest dissection.   Clinical Impression   Pt supine in bed and opening her eyes to her name. Pt able to look vertically and using up/down to answer yes/no questions. PTA, pt was living with a roommate and working as a Runner, broadcasting/film/video; unsure of accuracy. Pt requiring Total A for ADLs and bed mobility. Pt tolerating sitting at EOB with Total A for back and neck support. Pt would benefit from further acute OT to facilitate safe dc. Once pt medically stable and pending progress, recommend dc to CIR for intensive OT to optimize safety, independence with ADLs, and return to PLOF.      Follow Up Recommendations  CIR;Supervision/Assistance - 24 hour(Once medically stable)    Equipment Recommendations  Other (comment)(Defer to next venue)    Recommendations for Other Services PT consult;Rehab consult;Speech consult     Precautions / Restrictions Precautions Precautions: Fall      Mobility Bed Mobility Overal bed mobility: Needs Assistance Bed Mobility: Rolling;Supine to Sit;Sit to Supine Rolling: Total assist;+2 for physical assistance   Supine to sit: Total assist;+2 for  physical assistance Sit to supine: Total assist;+2 for physical assistance   General bed mobility comments: Requiring Total A +2 for bed mobility and sitting at EOB. Pt with no neck control and requiring significant assistance  Transfers Overall transfer level: Needs assistance               General transfer comment: did not attempt transfer today.  Will take mobility slowly.    Balance Overall balance assessment: Needs assistance Sitting-balance support: Feet supported Sitting balance-Leahy Scale: Zero Sitting balance - Comments: pt unable to assist balance, trace and non purposeful cervical movement                                   ADL either performed or assessed with clinical judgement   ADL Overall ADL's : Needs assistance/impaired                                       General ADL Comments: Total A for all ADLs     Vision Baseline Vision/History: Wears glasses Wears Glasses: At all times Patient Visual Report: Other (comment)(Denies any change) Vision Assessment?: Yes;Vision impaired- to be further tested in functional context Eye Alignment: Impaired (comment)(Dysconjugate gaze) Ocular Range of Motion: Restricted on the left;Restricted on the right Tracking/Visual Pursuits: Left eye does not track laterally;Right eye does not track laterally;Impaired - to be further tested in functional context Diplopia Assessment: (Denies) Additional Comments: Able to look up and down. Unable to look left and right. Dysconjugate gaze notes. Denies diplopia; unsure if accurate.  Perception     Praxis      Pertinent Vitals/Pain Pain Assessment: Faces Faces Pain Scale: No hurt Pain Intervention(s): Monitored during session     Hand Dominance     Extremity/Trunk Assessment Upper Extremity Assessment Upper Extremity Assessment: RUE deficits/detail;LUE deficits/detail RUE Deficits / Details: Withdrawl from pain. Will push into extension with  coughing. No active movement RUE Coordination: decreased fine motor;decreased gross motor LUE Deficits / Details: Will push into extension with coughing. No active movement. No withdrawl to pain LUE Sensation: decreased light touch(No sensation) LUE Coordination: decreased fine motor;decreased gross motor   Lower Extremity Assessment Lower Extremity Assessment: Defer to PT evaluation RLE Deficits / Details: trace pelvic movement, non purposeful. RLE Sensation: (pt related yes to feeling in R LE) RLE Coordination: decreased gross motor;decreased fine motor LLE Deficits / Details: no discernable movement, minimal reflex, no sensation. LLE Coordination: decreased gross motor;decreased fine motor       Communication Communication Communication: Other (comment)(intubated; pt looking up/down to communicate)   Cognition Arousal/Alertness: Awake/alert;Lethargic Behavior During Therapy: Flat affect;WFL for tasks assessed/performed(difficult to assess) Overall Cognitive Status: Difficult to assess(answers questions well, but difficult to assess)                                 General Comments: Answering yes/no questions with eye movements and accurate ~80% of time. Pt    General Comments  VSS,  RR high in sitting, 30's to low 40's, weaning,  BP in sitting  149/95 (111)     Exercises Exercises: Other exercises Other Exercises Other Exercises: PROM to all 4's during MMT   Shoulder Instructions      Home Living Family/patient expects to be discharged to:: Skilled nursing facility Living Arrangements: Non-relatives/Friends Available Help at Discharge: Friend(s) Type of Home: House                           Additional Comments: Will need more information on environment      Prior Functioning/Environment Level of Independence: Independent        Comments: school teacher        OT Problem List: Decreased strength;Decreased range of motion;Decreased  activity tolerance;Impaired balance (sitting and/or standing);Impaired vision/perception;Decreased cognition;Decreased coordination;Decreased safety awareness;Decreased knowledge of use of DME or AE;Decreased knowledge of precautions;Pain;Impaired UE functional use      OT Treatment/Interventions: Self-care/ADL training;Therapeutic exercise;Energy conservation;DME and/or AE instruction;Therapeutic activities;Patient/family education    OT Goals(Current goals can be found in the care plan section) Acute Rehab OT Goals Patient Stated Goal: Unstated OT Goal Formulation: Patient unable to participate in goal setting Time For Goal Achievement: 05/22/18 Potential to Achieve Goals: Good  OT Frequency: Min 2X/week   Barriers to D/C:            Co-evaluation PT/OT/SLP Co-Evaluation/Treatment: Yes Reason for Co-Treatment: Complexity of the patient's impairments (multi-system involvement);For patient/therapist safety;To address functional/ADL transfers PT goals addressed during session: Mobility/safety with mobility;Strengthening/ROM OT goals addressed during session: Strengthening/ROM      AM-PAC OT "6 Clicks" Daily Activity     Outcome Measure Help from another person eating meals?: Total Help from another person taking care of personal grooming?: Total Help from another person toileting, which includes using toliet, bedpan, or urinal?: Total Help from another person bathing (including washing, rinsing, drying)?: Total Help from another person to put on and taking off regular upper body clothing?: Total Help  from another person to put on and taking off regular lower body clothing?: Total 6 Click Score: 6   End of Session Equipment Utilized During Treatment: Other (comment)(Intubated) Nurse Communication: Mobility status;Other (comment)(Changed sheets)  Activity Tolerance: Patient tolerated treatment well;Patient limited by fatigue Patient left: in bed;with call bell/phone within  reach  OT Visit Diagnosis: Unsteadiness on feet (R26.81);Other abnormalities of gait and mobility (R26.89);Muscle weakness (generalized) (M62.81);Other symptoms and signs involving cognitive function;Low vision, both eyes (H54.2)                Time: 1050-1136 OT Time Calculation (min): 46 min Charges:  OT General Charges $OT Visit: 1 Visit OT Evaluation $OT Eval High Complexity: 1 High  Haydn Cush MSOT, OTR/L Acute Rehab Pager: [phone] Office: [phone]  Theodoro Grist Zacary Bauer 05/08/2018, 2:53 PM

## 2018-05-08 NOTE — Progress Notes (Signed)
STROKE TEAM PROGRESS NOTE   INTERVAL HISTORY Patient had neurological worsening during the day yesterday with forced left gaze deviation and increased right hemiplegia and was taken for emergent diagnostic cerebral catheter angiogram showed occluded left vertebral artery as well as distal right vertebral artery beyond the PICA with complete basilar artery occlusion. After informed consent from the patient's mother she underwent basilar angioplasty and stenting with excellent revascularization. I examined the patient postprocedure in PACU and post gaze deviation and improved but right hemiplegia with unchanged. She however shortly thereafter developed respiratory distress requiring reintubation. Repeat CT scan last night showed no acute changes. However on my exam this morning  She has worsened now with   quadriplegia and is  only   able to follow commands with vertical eye movements.blood pressure has been adequately controlled  Vitals:   05/08/18 0730 05/08/18 0800 05/08/18 0813 05/08/18 0900  BP: 125/72 132/81 132/81 129/77  Pulse: 93 (!) 104 94 97  Resp: (!) 22 (!) 23 (!) 27 (!) 25  Temp:  100.3 F (37.9 C)    TempSrc:  Axillary    SpO2: 98% 98% 98% 99%  Weight:      Height:        CBC:  Recent Labs  Lab 04/26/2018 2052 June 06, 2018 1816 05/08/18 0502  WBC 9.2  --  12.6*  NEUTROABS 5.9  --  10.5*  HGB 14.5 13.3 12.9  HCT 45.2 39.0 41.1  MCV 93.2  --  93.4  PLT 231  --  217    Basic Metabolic Panel:  Recent Labs  Lab 04/09/2018 2052 2018-06-06 1816 05/08/18 0502  NA 136 140 137  K 3.7 3.7 3.5  CL 107  --  106  CO2 22  --  22  GLUCOSE 110*  --  117*  BUN 12  --  <5*  CREATININE 0.65  --  0.62  CALCIUM 8.9  --  8.4*  MG  --   --  1.6*   Lipid Panel:     Component Value Date/Time   CHOL 273 (H) 06/06/18 0449   CHOL 227 (H) 02/25/2018 1047   TRIG 104 06-06-2018 2200   HDL 76 2018/06/06 0449   HDL 64 02/25/2018 1047   CHOLHDL 3.6 Jun 06, 2018 0449   VLDL 18 2018-06-06 0449    LDLCALC 179 (H) 06-Jun-2018 0449   LDLCALC 147 (H) 02/25/2018 1047   HgbA1c:  Lab Results  Component Value Date   HGBA1C 5.1 06-06-2018   Urine Drug Screen:     Component Value Date/Time   LABOPIA NONE DETECTED 06/06/18 1800   COCAINSCRNUR NONE DETECTED 2018-06-06 1800   LABBENZ NONE DETECTED 06/06/18 1800   AMPHETMU NONE DETECTED 2018-06-06 1800   THCU NONE DETECTED 06-06-2018 1800   LABBARB NONE DETECTED 06-06-2018 1800    Alcohol Level No results found for: ETH  IMAGING Ct Angio Head W Or Wo Contrast  Result Date: June 06, 2018 CLINICAL DATA:  LEFT-sided numbness this and neck pain. Follow-up infarcts. EXAM: CT ANGIOGRAPHY HEAD AND NECK TECHNIQUE: Multidetector CT imaging of the head and neck was performed using the standard protocol during bolus administration of intravenous contrast. Multiplanar CT image reconstructions and MIPs were obtained to evaluate the vascular anatomy. Carotid stenosis measurements (when applicable) are obtained utilizing NASCET criteria, using the distal internal carotid diameter as the denominator. CONTRAST:  75 cc ISOVUE-370 IOPAMIDOL (ISOVUE-370) INJECTION 76% COMPARISON:  CT HEAD May 06, 2018 and MRI head 2018-06-06. FINDINGS: CTA NECK FINDINGS: AORTIC ARCH: Normal  appearance of the thoracic arch, normal branch pattern. The origins of the innominate, left Common carotid artery and subclavian artery are patent. RIGHT CAROTID SYSTEM: Common carotid artery is patent. Normal appearance of the carotid bifurcation without hemodynamically significant stenosis by NASCET criteria. Normal appearance of the internal carotid artery. LEFT CAROTID SYSTEM: Common carotid artery is patent. Normal appearance of the carotid bifurcation without hemodynamically significant stenosis by NASCET criteria. Normal appearance of the internal carotid artery. VERTEBRAL ARTERIES:LEFT vertebral artery occluded from origin without reconstitution in the neck. Normal RIGHT vertebral  artery. SKELETON: No acute osseous process though bone windows have not been submitted. OTHER NECK: Soft tissues of the neck are nonacute though, not tailored for evaluation. UPPER CHEST: Mild heterogeneous lung attenuation seen with small airway disease. CTA HEAD FINDINGS: ANTERIOR CIRCULATION: Patent cervical internal carotid arteries, petrous, cavernous and supra clinoid internal carotid arteries. Patent anterior communicating artery. Patent anterior and middle cerebral arteries. No large vessel occlusion, flow-limiting stenosis, contrast extravasation or aneurysm. 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 Occluded distal RIGHT vertebral artery. Occluded vertebrobasilar junction and basilar artery with collateral vessel within interpeduncular notch. Reconstitution of basilar artery at tip. Robust posterior communicating arteries present. No large vessel occlusion, flow-limiting stenosis, contrast extravasation or aneurysm. VENOUS SINUSES: Major dural venous sinuses are patent though not tailored for evaluation on this angiographic examination. ANATOMIC VARIANTS: None. DELAYED PHASE: Similar nodular enhancement LEFT > RIGHT occipital lobes corresponding to subacute infarcts. MIP images reviewed. IMPRESSION: CTA NECK: 1. 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 Patent RIGHT vertebral artery. 2. Normal bilateral ICA's. CTA HEAD: 1. 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 Acute findings discussed with and reconfirmed by Dr.Kirkpatrick, Neurology on May 17, 2018 at 4:01 am. Electronically Signed   By: Awilda Metro M.D.   On: 05/17/18 04:03   Ct Head Wo Contrast  Result Date: 2018-05-17 CLINICAL DATA:  Stroke follow-up EXAM: CT HEAD WITHOUT CONTRAST TECHNIQUE: Contiguous axial images were obtained from the base of  the skull through the vertex without intravenous contrast. COMPARISON:  Head CT 04/09/2018 FINDINGS: Brain: Unchanged hypoattenuation in the cerebellar hemispheres compatible with infarcts of mixed ages as demonstrated on recent MRI. No intracranial hemorrhage. No midline shift or other mass effect. The supratentorial brain is unremarkable. Vascular: No abnormal hyperdensity of the major intracranial arteries or dural venous sinuses. No intracranial atherosclerosis. Skull: The visualized skull base, calvarium and extracranial soft tissues are normal. Sinuses/Orbits: No fluid levels or advanced mucosal thickening of the visualized paranasal sinuses. No mastoid or middle ear effusion. The orbits are normal. 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 No hemorrhage or mass effect. Electronically Signed   By: Deatra Robinson M.D.   On: 05/17/18 20:42   Ct Head Wo Contrast  Result Date: 05/02/2018 CLINICAL DATA:  28 year old female with left leg numbness and weakness. EXAM: CT HEAD WITHOUT CONTRAST TECHNIQUE: Contiguous axial images were obtained from the base of the skull through the vertex without intravenous contrast. COMPARISON:  None. FINDINGS: Brain: The ventricles and sulci appropriate size for patient's age. Patchy areas of hypodensity in the cerebellar hemispheres bilaterally, left greater right, most consistent with areas of infarct of indeterminate age. Acute infarct is not excluded. Clinical correlation is recommended. If there is clinical concern for acute infarct further evaluation with MRI is recommended. A somewhat linear area hypodensity in the right cerebellar hemisphere likely represents an old infarct.  There is no acute intracranial hemorrhage. No mass effect or midline shift. No extra-axial fluid collection. Vascular: No hyperdense vessel or unexpected calcification. Skull: Normal. Negative for fracture or focal lesion. Sinuses/Orbits: No acute finding. Other:  None IMPRESSION: 1. No acute intracranial hemorrhage. 2. Patchy areas of hypodensity in the cerebellar hemispheres bilaterally, left greater right, most consistent with areas of infarct of indeterminate age. Acute infarct is not excluded. Clinical correlation is recommended. If there is clinical concern for acute infarct further evaluation with MRI recommended. These results were called by telephone at the time of interpretation on 2018/05/29 at 9:25 pm to Dr. Loren Racer , who verbally acknowledged these results. Electronically Signed   By: Elgie Collard M.D.   On: 29-May-2018 21:31   Ct Angio Neck W And/or Wo Contrast  Result Date: 05/04/2018 CLINICAL DATA:  LEFT-sided numbness this and neck pain. Follow-up infarcts. EXAM: CT ANGIOGRAPHY HEAD AND NECK TECHNIQUE: Multidetector CT imaging of the head and neck was performed using the standard protocol during bolus administration of intravenous contrast. Multiplanar CT image reconstructions and MIPs were obtained to evaluate the vascular anatomy. Carotid stenosis measurements (when applicable) are obtained utilizing NASCET criteria, using the distal internal carotid diameter as the denominator. CONTRAST:  75 cc ISOVUE-370 IOPAMIDOL (ISOVUE-370) INJECTION 76% COMPARISON:  CT HEAD 05/29/2018 and MRI head May 07, 2018. FINDINGS: CTA NECK FINDINGS: AORTIC ARCH: Normal appearance of the thoracic arch, normal branch pattern. The origins of the innominate, left Common carotid artery and subclavian artery are patent. RIGHT CAROTID SYSTEM: Common carotid artery is patent. Normal appearance of the carotid bifurcation without hemodynamically significant stenosis by NASCET criteria. Normal appearance of the internal carotid artery. LEFT CAROTID SYSTEM: Common carotid artery is patent. Normal appearance of the carotid bifurcation without hemodynamically significant stenosis by NASCET criteria. Normal appearance of the internal carotid artery. VERTEBRAL ARTERIES:LEFT  vertebral artery occluded from origin without reconstitution in the neck. Normal RIGHT vertebral artery. SKELETON: No acute osseous process though bone windows have not been submitted. OTHER NECK: Soft tissues of the neck are nonacute though, not tailored for evaluation. UPPER CHEST: Mild heterogeneous lung attenuation seen with small airway disease. CTA HEAD FINDINGS: ANTERIOR CIRCULATION: Patent cervical internal carotid arteries, petrous, cavernous and supra clinoid internal carotid arteries. Patent anterior communicating artery. Patent anterior and middle cerebral arteries. No large vessel occlusion, flow-limiting stenosis, contrast extravasation or aneurysm. 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 Occluded distal RIGHT vertebral artery. Occluded vertebrobasilar junction and basilar artery with collateral vessel within interpeduncular notch. Reconstitution of basilar artery at tip. Robust posterior communicating arteries present. No large vessel occlusion, flow-limiting stenosis, contrast extravasation or aneurysm. VENOUS SINUSES: Major dural venous sinuses are patent though not tailored for evaluation on this angiographic examination. ANATOMIC VARIANTS: None. DELAYED PHASE: Similar nodular enhancement LEFT > RIGHT occipital lobes corresponding to subacute infarcts. MIP images reviewed. IMPRESSION: CTA NECK: 1. 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 Patent RIGHT vertebral artery. 2. Normal bilateral ICA's. CTA HEAD: 1. 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 Acute findings discussed with and reconfirmed by Dr.Kirkpatrick, Neurology on 04/23/2018 at 4:01 am. Electronically Signed   By: Awilda Metro M.D.   On: 04/16/2018 04:03   Mr Brain W And Wo Contrast  Result Date: 04/10/2018 CLINICAL DATA:  LEFT neck  pain for a few days. LEFT arm numbness beginning this afternoon and transient slurred speech. EXAM: MRI  HEAD WITHOUT AND WITH CONTRAST MRI CERVICAL SPINE WITHOUT AND WITH CONTRAST TECHNIQUE: Multiplanar, multiecho pulse sequences of the brain and surrounding structures, and cervical spine, to include the craniocervical junction and cervicothoracic junction, were obtained without and with intravenous contrast. CONTRAST:  8 cc Gadavist COMPARISON:  CT HEAD May 06, 2018 FINDINGS: MRI HEAD FINDINGS INTRACRANIAL CONTENTS: Patchy LEFT > RIGHT cerebellar and pontine reduced diffusion with low ADC values and to lesser extent normalized ADC values. Nodular enhancement of LEFT > RIGHT occipital lobes consistent with subacute infarct. Old small RIGHT cerebellar, LEFT splenium of corpus callosum, LEFT periventricular white matter and LEFT occipital infarcts. No midline shift, masses or abnormal extra-axial fluid collections. No parenchymal brain volume loss for age. No hydrocephalus. No abnormal parenchymal or extra-axial enhancement though post gadolinium sequences are moderately motion degraded. VASCULAR: Slightly attenuated LEFT vertebral artery flow void in the neck. Attenuated basilar artery flow void. SKULL AND UPPER CERVICAL SPINE: No abnormal sellar expansion. No suspicious calvarial bone marrow signal. Craniocervical junction maintained. SINUSES/ORBITS: The mastoid air-cells and included paranasal sinuses are well-aerated.The included ocular globes and orbital contents are non-suspicious. OTHER: None. MRI CERVICAL SPINE FINDINGS ALIGNMENT: Straightened cervical lordosis.  No malalignment. VERTEBRAE/DISCS: Vertebral bodies are intact. Intervertebral disc morphology's and signal are normal. No abnormal or acute bone marrow signal. No abnormal osseous or disc enhancement. CORD:Cervical spinal cord is normal morphology and signal characteristics from the cervicomedullary junction to level of T2-3, the most caudal well  visualized level. POSTERIOR FOSSA, VERTEBRAL ARTERIES, PARASPINAL TISSUES: No MR findings of ligamentous injury. Attenuated LEFT vertebral artery flow void. DISC LEVELS: No disc bulge, canal stenosis nor neural foraminal narrowing. IMPRESSION: MRI head: 1. 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 2. Attenuated LEFT vertebral artery and basilar artery seen with slow flow or occlusion. Recommend CTA HEAD and NECK. MRI cervical spine: 1. Slow flow versus occluded LEFT vertebral artery. 2. Otherwise negative MRI cervical spine with and without contrast. Acute findings discussed with and reconfirmed by Dr.KRISTEN WARD on 04/28/2018 at 3:14 am. Electronically Signed   By: Awilda Metroourtnay  Bloomer M.D.   On: 04/07/2018 03:20   Mr Cervical Spine W Or Wo Contrast  Result Date: 04/07/2018 CLINICAL DATA:  LEFT neck pain for a few days. LEFT arm numbness beginning this afternoon and transient slurred speech. EXAM: MRI HEAD WITHOUT AND WITH CONTRAST MRI CERVICAL SPINE WITHOUT AND WITH CONTRAST TECHNIQUE: Multiplanar, multiecho pulse sequences of the brain and surrounding structures, and cervical spine, to include the craniocervical junction and cervicothoracic junction, were obtained without and with intravenous contrast. CONTRAST:  8 cc Gadavist COMPARISON:  CT HEAD May 06, 2018 FINDINGS: MRI HEAD FINDINGS INTRACRANIAL CONTENTS: Patchy LEFT > RIGHT cerebellar and pontine reduced diffusion with low ADC values and to lesser extent normalized ADC values. Nodular enhancement of LEFT > RIGHT occipital lobes consistent with subacute infarct. Old small RIGHT cerebellar, LEFT splenium of corpus callosum, LEFT periventricular white matter and LEFT occipital infarcts. No midline shift, masses or abnormal extra-axial fluid collections. No parenchymal brain volume loss for age. No hydrocephalus. No abnormal parenchymal or extra-axial enhancement though post gadolinium sequences are moderately  motion degraded. VASCULAR: Slightly attenuated LEFT vertebral artery flow void in the neck. Attenuated basilar artery flow void. SKULL AND UPPER CERVICAL SPINE: No abnormal sellar expansion. No suspicious calvarial bone marrow signal. Craniocervical junction maintained. SINUSES/ORBITS: The mastoid air-cells and included paranasal sinuses are well-aerated.The included ocular globes and orbital contents are non-suspicious. OTHER: None. MRI CERVICAL SPINE FINDINGS ALIGNMENT: Straightened  cervical lordosis.  No malalignment. VERTEBRAE/DISCS: Vertebral bodies are intact. Intervertebral disc morphology's and signal are normal. No abnormal or acute bone marrow signal. No abnormal osseous or disc enhancement. CORD:Cervical spinal cord is normal morphology and signal characteristics from the cervicomedullary junction to level of T2-3, the most caudal well visualized level. POSTERIOR FOSSA, VERTEBRAL ARTERIES, PARASPINAL TISSUES: No MR findings of ligamentous injury. Attenuated LEFT vertebral artery flow void. DISC LEVELS: No disc bulge, canal stenosis nor neural foraminal narrowing. IMPRESSION: MRI head: 1. 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 2. Attenuated LEFT vertebral artery and basilar artery seen with slow flow or occlusion. Recommend CTA HEAD and NECK. MRI cervical spine: 1. Slow flow versus occluded LEFT vertebral artery. 2. Otherwise negative MRI cervical spine with and without contrast. Acute findings discussed with and reconfirmed by Dr.KRISTEN WARD on 04/07/2018 at 3:14 am. Electronically Signed   By: Awilda Metro M.D.   On: 04/12/2018 03:20   Dg Chest Portable 1 View  Result Date: 04/13/2018 CLINICAL DATA:  Intubation EXAM: PORTABLE CHEST 1 VIEW COMPARISON:  Portable exam 1707 hours compared to 05/04/2018 at 0610 hours FINDINGS: Tip of endotracheal tube projects 4.0 cm above carina. Normal heart size, mediastinal contours, and pulmonary vascularity. Lungs  clear. No definite infiltrate, pleural effusion or pneumothorax. IMPRESSION: No acute abnormalities. Electronically Signed   By: Ulyses Southward M.D.   On: 04/17/2018 17:44   Dg Chest Port 1 View  Result Date: 04/21/2018 CLINICAL DATA:  Stroke EXAM: PORTABLE CHEST 1 VIEW COMPARISON:  None. FINDINGS: Normal heart size and mediastinal contours. No acute infiltrate or edema. No effusion or pneumothorax. No acute osseous findings. IMPRESSION: Negative chest. Electronically Signed   By: Marnee Spring M.D.   On: 05/05/2018 06:26   Dg Abd Portable 1v  Result Date: 04/21/2018 CLINICAL DATA:  Orogastric tube placement EXAM: PORTABLE ABDOMEN - 1 VIEW COMPARISON:  None. FINDINGS: The side port of the orogastric tube projects over the gastric body. The tip is excluded view. Visualized right lung base is clear. IMPRESSION: Orogastric tube side port overlying the gastric body. Electronically Signed   By: Deatra Robinson M.D.   On: 04/17/2018 19:30   Cerebral angiogram 05/05/2018 S/P Rt vert arteriogram followed by complete revascularization of  Entire basilar  Artery with x 2 passes with 5mm x 33mm embotrap retrieverdevice and x 1 pass with 4mm x 40 mm solitaire FR retriever device achieving a TICI 3 revascularization and placement od enterprise stent atsite of flow limiting stenosis in the prox bailar artery with a TICI 3 revascularization  2D Echocardiogram   1. The left ventricle has normal systolic function with an ejection fraction of 60-65%. The cavity size was normal. Left ventricular diastolic parameters were normal.  2. The right ventricle has normal systolic function. The cavity was normal. There is no increase in right ventricular wall thickness.  3. Agitated saline exam indeterminate due to image quality, however no large right to left shunt seen.  4. No intracardiac mass or thrombi identified. No intracardiac source of emboli identified.   PHYSICAL EXAM: Young Caucasian lady was intubated and not  sedated . Afebrile. Head is nontraumatic. Neck is supple without bruit.    Cardiac exam no murmur or gallop. Lungs are clear to auscultation. Distal pulses are well felt. Neurological Exam :  Patient is  Awake with eyes open. She will follow commands only with vertical eye movements and opening and closing her eyes. 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 She is unable to protrude her tongue. She has very weak cough and gag. She has quadriplegia and is unable to move her hands and feet voluntarily. She is does withdrawal semi-purposefully briskly in the lower extremities and minimally in both upper extremities. Plantars downgoing. Gait not tested.   ASSESSMENT/PLAN Ms. Geraldyn Shain is a 28 y.o. female with no significant PMH presenting to [facility] with acute onset slurred speech followed by diplopia and L sided numbness.     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 However presence of old cerebellar infarcts raises possibility of occlusive posterior circulation disease with acute on chronic occlusion. S/p IR w/ TICI3 revascularization BA w/ stent placement. 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  CT head patchy B cerebellar L>R hypodensity  MRI head multifocial acute, subacute and chornic posterior circulation infarcts. Attenuated L VA and BA w/ slow flow, occlusion  MRI CS L VA slow flow vs occlusion, o/w neg  CTA head occluded L and distal R VA and VBJ. Occluded BA w/ reconstitution basilar tip and complete COW.  CTA neck occluded L VA. Patent R VA. Normal ICAs  Cerebral angio TICI3 revascularization BA w/ stent placement following admission  Post IR CT no ICH or mass effect. No hydrocephalus.  Ventricles symmetric.   CT head unchanged B cerebellar infarcts of mixed ages  MRI/MRA head pending   2D Echo bubble EF 60-65%. No source of embolus. Neg bubble  LDL 179  HgbA1c 5.1  IV hperain for VTE prophylaxis  No antithrombotic prior to admission, now on aspirin 81 mg daily and Brilinta (ticagrelor) 90 mg bid.continue Brilinta x 30 days along with aspirin at discharge then change to plavix and aspirin.  Therapy recommendations:  pending   Disposition:  pending   Respiratory Failure  Compromised airway following IR and BA stent   Intubated on 4N  Sedated   CCM onboard  Blood Pressure  SBP goal per neuroradiology post IR x 24h, goal 120-140  On cleviprex drip  Dysphagia  Secondary to stroke  NPO  Meds per tube  Hyperlipidemia  Home meds:  No statin  LDL 179, goal < 70  Add statin - lipitor 80  Continue statin at discharge  Other Stroke Risk Factors  ETOH use, advised to drink no more than 1 drink(s) a day  Overweight  Body mass index is 28.53 kg/m., recommend weight loss, diet and exercise as appropriate   Other Active Problems  Anxiety/depression on zoloft  OCD on rexulti  CXR neg  Hospital day # 1 I have personally obtained history,examined this patient, reviewed notes, independently viewed imaging studies, participated in medical decision making and plan of care.ROS completed by me personally and pertinent positives fully documented  I have made any additions or clarifications directly to the above note. 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 Recommend check MRI scan of the brain with MRA of the brain to  look for ways stent reocclusion. Continue ventilatory support. Patient  likely will need prolonged ventilatory support and feeding tube and prognosis is guarded. Discussed with patient's mother over the phone and answered questions. Discussed with critical care M.D.     Delia Heady, MD Medical Director [facility] Pager: [phone] 05/08/2018 10:27 AM   To contact Stroke Continuity provider, please refer to [email or web address]. After hours, contact General Neurology

## 2018-05-08 NOTE — Progress Notes (Signed)
SLP Cancellation Note  Patient Details Name: Erika Hernandez MRN: [id number] DOB: 1990-09-26   Cancelled treatment:       Reason Eval/Treat Not Completed: Medical issues which prohibited therapy;Patient at procedure or test/unavailable. Pt currently intubated. SLP will follow up.   Mykah Shin I. Vear Clock, MS, CCC-SLP Acute Rehabilitation Services Office number [phone] Pager [phone]   Scheryl Marten 05/08/2018, 8:14 AM

## 2018-05-08 NOTE — Progress Notes (Signed)
Rehab Admissions Coordinator Note:  Per PT and OT recommendation, this patient was screened by Nanine Means for appropriateness for an Inpatient Acute Rehab Consult.  At this time, we will follow for medical stability and progress with therapies, as pt remains vented and total assist for mobility.   Nanine Means 05/08/2018, 3:12 PM  I can be reached at [phone].

## 2018-05-08 NOTE — Progress Notes (Signed)
STROKE TEAM ADDENDUM NOTE  I personally reviewed MRI films from today which shows interval evolution of a multifocal posterior cerebral circulation infarcts involving bilateral cerebellar hemispheres as well as pons which appear to have increased compared with previous MRI from yesterday. MRA shows normal flow in both distal vertebral arteries as well as basilar arteries suggesting likely occlusion of her basilar stent. 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 I discussed these findings with the patient's mother over the phone and explained that her prognosis was poor and she would need prolonged life support and nursing care. She voiced understanding. She stated she will try to get her son who lives in [place] to come to see her and would need a note from [place] to ground him permission from [employer] to come here. Delia Heady, MD Medical Director [facility] Pager: [phone] 05/08/2018 3:20 PM

## 2018-05-08 NOTE — Progress Notes (Signed)
RT NOTE:  Transported pt on vent to MRI and back to 4N32 without incident.

## 2018-05-08 NOTE — Evaluation (Signed)
Physical Therapy Evaluation Patient Details Name: Erika Hernandez MRN: [id number] DOB: 19-Jul-1990 Today's Date: 05/08/2018   History of Present Illness  28 year old Caucasian lady with no significant past neurological history with subacute headache neck pain as well as worsening neurlogical symptoms of headache, gait ataxia, slurred speech, numbness  and diplopia. She was started on IV heparin and has remained stable overnight. 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  She denies any significant head injury, motor vehicle accident, repetitive neck jerking movements, chiropractic manipulation to suggest dissection.  Clinical Impression  Pt admitted with/for stroke described above.  Pt responding only with eye movement.  Pt is presently total +2 for all mobility.  Pt currently limited functionally due to the problems listed. ( See problems list.)   Pt will benefit from PT to maximize function and safety in order to get ready for next venue listed below.     Follow Up Recommendations CIR;Other (comment)(when medically ready.)    Equipment Recommendations  Other (comment)(T)    Recommendations for Other Services       Precautions / Restrictions Precautions Precautions: Fall      Mobility  Bed Mobility Overal bed mobility: Needs Assistance Bed Mobility: Rolling;Supine to Sit;Sit to Supine Rolling: Total assist;+2 for physical assistance   Supine to sit: Total assist;+2 for physical assistance Sit to supine: Total assist;+2 for physical assistance   General bed mobility comments: pt unable to assist.  Need for significant head control also.  Transfers Overall transfer level: Needs assistance               General transfer comment: did not attempt  transfer today.  Will take mobility slowly.  Ambulation/Gait                Stairs            Wheelchair Mobility    Modified Rankin (Stroke Patients Only) Modified Rankin (Stroke Patients Only) Pre-Morbid Rankin Score: No symptoms Modified Rankin: Severe disability     Balance Overall balance assessment: Needs assistance Sitting-balance support: Feet supported Sitting balance-Leahy Scale: Zero Sitting balance - Comments: pt unable to assist balance, trace and non purposeful cervical movement                                     Pertinent Vitals/Pain Pain Assessment: No/denies pain    Home Living Family/patient expects to be discharged to:: Skilled nursing facility Living Arrangements: Non-relatives/Friends Available Help at Discharge: Friend(s) Type of Home: House           Additional Comments: Will need more information on environment    Prior Function Level of Independence: Independent         Comments: school teacher     Hand Dominance        Extremity/Trunk Assessment   Upper Extremity Assessment Upper Extremity Assessment: Defer to OT evaluation    Lower Extremity Assessment Lower Extremity Assessment: RLE deficits/detail;LLE deficits/detail RLE Deficits / Details: trace pelvic movement, non purposeful. RLE Sensation: (pt related yes to feeling in R LE) RLE Coordination: decreased gross motor;decreased fine motor LLE Deficits / Details: no discernable movement, minimal reflex, no sensation. LLE Coordination: decreased gross motor;decreased fine motor       Communication  Communication: (intubated)  Cognition Arousal/Alertness: Awake/alert;Lethargic Behavior During Therapy: Flat affect;WFL for tasks assessed/performed(difficult to assess) Overall Cognitive Status: Within Functional Limits for tasks assessed(answers questions well, but difficult to assess)                                         General Comments General comments (skin integrity, edema, etc.): VSS,  RR high in sitting, 30's to low 40's, weaning,  BP in sitting  149/95 (111)     Exercises Other Exercises Other Exercises: PROM to all 4's during MMT   Assessment/Plan    PT Assessment Patient needs continued PT services  PT Problem List Decreased strength;Decreased activity tolerance;Decreased balance;Decreased mobility;Decreased coordination;Cardiopulmonary status limiting activity;Impaired sensation;Impaired tone       PT Treatment Interventions DME instruction;Functional mobility training;Therapeutic activities;Neuromuscular re-education;Patient/family education;Therapeutic exercise;Balance training    PT Goals (Current goals can be found in the Care Plan section)  Acute Rehab PT Goals PT Goal Formulation: Patient unable to participate in goal setting Time For Goal Achievement: 05/22/18 Potential to Achieve Goals: Fair    Frequency Min 3X/week   Barriers to discharge        Co-evaluation PT/OT/SLP Co-Evaluation/Treatment: Yes Reason for Co-Treatment: Complexity of the patient's impairments (multi-system involvement) PT goals addressed during session: Mobility/safety with mobility;Strengthening/ROM OT goals addressed during session: Strengthening/ROM       AM-PAC PT "6 Clicks" Mobility  Outcome Measure Help needed turning from your back to your side while in a flat bed without using bedrails?: Total Help needed moving from lying on your back to sitting on the side of a flat bed without using bedrails?: Total Help needed moving to and from a bed to a chair (including a wheelchair)?: Total Help needed standing up from a chair using your arms (e.g., wheelchair or bedside chair)?: Total Help needed to walk in hospital room?: Total Help needed climbing 3-5 steps with a railing? : Total 6 Click Score: 6    End of Session   Activity Tolerance: Patient tolerated treatment well Patient left: in bed;with  call bell/phone within reach;with bed alarm set   PT Visit Diagnosis: Other (comment);Pain;Other symptoms and signs involving the nervous system (R29.898)    Time: 6294-7654 PT Time Calculation (min) (ACUTE ONLY): 46 min   Charges:   PT Evaluation $PT Eval High Complexity: 1 High PT Treatments $Therapeutic Activity: 8-22 mins        05/08/2018  Giltner Bing, PT Acute Rehabilitation Services [phone]  (pager) [phone]  (office)  Eliseo Gum Ruthmary Occhipinti 05/08/2018, 12:21 PM

## 2018-05-08 DEATH — deceased

## 2018-05-09 LAB — GLUCOSE, CAPILLARY
Glucose-Capillary: 79 mg/dL (ref 70–99)
Glucose-Capillary: 78 mg/dL (ref 70–99)
Glucose-Capillary: 89 mg/dL (ref 70–99)
Glucose-Capillary: 118 mg/dL — ABNORMAL HIGH (ref 70–99)
Glucose-Capillary: 108 mg/dL — ABNORMAL HIGH (ref 70–99)
Glucose-Capillary: 106 mg/dL — ABNORMAL HIGH (ref 70–99)

## 2018-05-09 LAB — CBC
HCT: 38.9 % (ref 36.0–46.0)
Hemoglobin: 12.7 g/dL (ref 12.0–15.0)
MCH: 30.8 pg (ref 26.0–34.0)
MCHC: 32.6 g/dL (ref 30.0–36.0)
MCV: 94.2 fL (ref 80.0–100.0)
Platelets: 156 10*3/uL (ref 150–400)
RBC: 4.13 MIL/uL (ref 3.87–5.11)
RDW: 12.4 % (ref 11.5–15.5)
WBC: 10.9 10*3/uL — ABNORMAL HIGH (ref 4.0–10.5)
nRBC: 0 % (ref 0.0–0.2)

## 2018-05-09 MED ORDER — PRO-STAT SUGAR FREE PO LIQD
60.0000 mL | Freq: Two times a day (BID) | ORAL | Status: DC
  Administered 2018-05-09 – 2018-05-10 (×4): 60 mL
  Filled 2018-05-09 (×5): qty 60

## 2018-05-09 MED ORDER — HEPARIN SODIUM (PORCINE) 5000 UNIT/ML IJ SOLN
5000.0000 [IU] | Freq: Three times a day (TID) | INTRAMUSCULAR | Status: DC
  Administered 2018-05-09 – 2018-05-11 (×6): 5000 [IU] via SUBCUTANEOUS
  Filled 2018-05-09 (×6): qty 1

## 2018-05-09 MED ORDER — JEVITY 1.2 CAL PO LIQD
1000.0000 mL | ORAL | Status: DC
  Administered 2018-05-09 – 2018-05-10 (×2): 1000 mL
  Filled 2018-05-09 (×4): qty 1000

## 2018-05-09 NOTE — Progress Notes (Signed)
Patient ID: Erika Hernandez, female   DOB: [birth year], 28 y.o.   MRN: [id number]   IR Round note via phone per new regulations  Basilar artery revascularization /stent in IR 3/31  IMPRESSION: yesterday MRI HEAD IMPRESSION: 1. Interval evolution of multifocal acute ischemic infarcts involving the bilateral cerebellar hemispheres as well as the pons and brainstem, increased in size in severity as compared to previous MRI. Developing regional mass effect with mild partial effacement of the fourth ventricle as compared to previous exam. No hydrocephalus at this time. 2. Interval development of small volume scattered petechial hemorrhage within both cerebral hemispheres as well as the pons. 3. Abnormal flow voids throughout the vertebrobasilar system, better evaluated on concomitant MRA as a below.  MRA HEAD IMPRESSION: 1. Absent flow related signal within both vertebral arteries and basilar artery, concerning for occlusion overall, this has worsened from prior CTA, with no appreciable flow seen within the right V4 segment on current exam (preserved on prior study). Distal reconstitution of the distal basilar artery via collateralization via the anterior circulation. Superior cerebellar and posterior cerebral arteries are perfused. 2. Attenuation of the distal left PCA, likely embolic, in consistent with previously identified subacute to chronic left splenial/occipital lobe infarcts. 3. Widely patent anterior circulation.  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

## 2018-05-09 NOTE — Progress Notes (Signed)
SLP Cancellation Note  Patient Details Name: Noshin Degrazia MRN: [id number] DOB: 01-07-1991   Cancelled treatment:       Reason Eval/Treat Not Completed: Medical issues which prohibited therapy;Patient at procedure or test/unavailable. Pt remains intubated.   Alexandro Line I. Vear Clock, MS, CCC-SLP Acute Rehabilitation Services Office number [phone] Pager [phone]  Scheryl Marten 05/09/2018, 8:01 AM

## 2018-05-09 NOTE — Progress Notes (Signed)
Initial Nutrition Assessment RD working remotely.  DOCUMENTATION CODES:   Not applicable  INTERVENTION:   Initiate via OG tube Jevity 1.2 @ 41 ml/hr 60 ml Prostat BID  Provides: 1580 kcal, 114 grams of protein, and 798 ml free water.  82% of kcal needs and 100% of protein needs   NUTRITION DIAGNOSIS:   Inadequate oral intake related to inability to eat as evidenced by NPO status.  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    Per MD pt will not likely be able to protect her airway due to her neurological injury and would need a trach and PEG.  Spoke with RN over the phone.   Patient is currently intubated on ventilator support MV: 7.9 L/min Temp (24hrs), Avg:99.4 F (37.4 C), Min:97.7 F (36.5 C), Max:100.2 F (37.9 C)  Medications and labs reviewed   NUTRITION - FOCUSED PHYSICAL EXAM:  Deferred   Diet Order:   Diet Order    None      EDUCATION NEEDS:   No education needs have been identified at this time  Skin:  Skin Assessment: Reviewed RN Assessment  Last BM:  unknown  Height:   Ht Readings from Last 1 Encounters:  04/23/2018 5\' 8"  (1.727 m)    Weight:   Wt Readings from Last 1 Encounters:  04/10/2018 85.1 kg    Ideal Body Weight:     BMI:  Body mass index is 28.53 kg/m.  Estimated Nutritional Needs:   Kcal:  1917  Protein:  102-127 grams  Fluid:  > 1.9 L/day  Kendell Bane RD, LDN, CNSC [phone] Pager [phone] After Hours Pager

## 2018-05-09 NOTE — Progress Notes (Signed)
Assisted with teleconference between patient, patient's family, and bedside staff. 

## 2018-05-09 NOTE — Progress Notes (Signed)
RT NOTE:  MD placed pt on weaning trial 20/5 at 0920. Pt only lasted for a hour before we had to place pt back on previous settings. Pt became tired and her respiratory efforts declined. Pt's is stable at this time. RT will continue to monitor.

## 2018-05-09 NOTE — Progress Notes (Signed)
NAME:  Erika Hernandez, MRN:  [id number], DOB:  [birth year], LOS: 2 ADMISSION DATE:  04/13/2018, CONSULTATION DATE:  04/09/2018 REFERRING MD: Dr. Corliss Skains CHIEF COMPLAINT: Stroke  Brief History   28 yoF presenting with neck pain x3 days with acute slurred speech, ataxia, left weakness, and left gaze developing diplopa found to have acute left vertebral and basilar occlusion/ dissection.  Admitted to ICU, failed medical treatment with heparin gtt with progressive neurological decline and taken to IR with successful revascularization and stent placement.  Failed extubation in PACU and intubated for airway protection.  History of present illness    28 year old female with history of depression, presenting 3/30 to [facility] with several day history of neck pain.  No reported injury prior to. LSW 3/30 around 1500.  Developed slurred speech followed by left numbness, left gaze, diplopa No reported injury. Initial CT showed cerebellar hypodensity.  Was transferred to [facility], Neurology consulted.  CTA showed left vertebral occlusion, likely dissection as well as basilar occlusion with subacute bilateral cerebellar infarcts.  She was admitted to Neurology and ICU and placed on heparin.  Throughout the day, patient had progressive neurological decline.  The decision was made to take patient for endovascular repair.  Patient taken to IR with complete revascularization of right vertebral artery and revascularization of basilar artery with stent placement.  Was extubated in PACU however patient unable to protect airway and therefore reintubated.  Sent to ICU, PCCM consulted for ventilator management.   Past Medical History  Depression  Significant Hospital Events   3/30 Admit 3/31 IR/ intubated   Consults:  IR PCCM  Procedures:  3/31 cerebral angiogram/ IR 3/31 ETT >>  Significant Diagnostic Tests:   3/30 CTH >>  1. No acute intracranial hemorrhage. 2. Patchy areas of hypodensity in the cerebellar  hemispheres bilaterally, left greater right, most consistent with areas of infarct of indeterminate age. Acute infarct is not excluded.  3/31 MRI brain/ cervical >> 1. 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 2. Attenuated LEFT vertebral artery and basilar artery seen with slow flow or occlusion. 1. Slow flow versus occluded LEFT vertebral artery. 2. Otherwise negative MRI cervical spine with and without contrast.  3/31 CTA head/ neck >> 1. 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 Patent RIGHT vertebral artery. 2. Normal bilateral ICA's. 1. 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  3/31 CT head >> unchanged bilateral cerebellar infarcts of mid stages, no bleed, no mass-effect  4/1 MR I/MRA brain >> interval evolution multifocal ischemic infarcts involving bilateral cerebellar hemispheres, progressed, larger.  Interval development small volume scattered petechial hemorrhage bilateral cerebral hemispheres and pons.  Partial effacement fourth ventricle.  Abnormal flow right greater than left vertebral arteries, basilar artery.  Micro Data:  3/31 MRSA PCR > neg  Antimicrobials:  None  Interim history/subjective:  Sedation off for the last 24 hours Cleviprex off Imaging reviewed as above  Objective   Blood pressure 133/77, pulse 63, temperature 99.3 F (37.4 C), temperature source Axillary, resp. rate 14, height 5\' 8"  (1.727 m), weight 85.1 kg, last menstrual period 05/01/2018, SpO2 99 %.    Vent Mode: PRVC FiO2 (%):  [40 %] 40 % Set Rate:  [12 bmp] 12 bmp Vt Set:  [510 mL] 510 mL PEEP:  [5 cmH20] 5 cmH20 Pressure Support:  [10 cmH20] 10 cmH20 Plateau Pressure:  [16 cmH20-20 cmH20] 16 cmH20  Intake/Output Summary (Last 24 hours) at 05/09/2018 0902 Last data filed at 05/09/2018  0800 Gross per 24 hour  Intake 1598.85 ml  Output 2000 ml  Net -401.15 ml   Filed Weights   05/03/2018 2038 04/10/2018 0520  Weight: 83.9 kg 85.1 kg    Examination: General: Ill-appearing young woman, ventilated, awake HENT: ET tube in place, OG tube in place Lungs: Clear bilaterally, decreased at the bases, no wheezing Cardiovascular: Geter, no murmur Abdomen: Soft, nondistended, positive bowel sounds Extremities: No significant edema Neuro: Eyes are open.  She appears to be communicating by blinking.  Withdraws to pain.  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  Hypertension Cleviprex now discontinued Add enteral blood pressure control if necessary, not currently requiring  Acute respiratory failure, principally due to poor airway protection Okay to continue SBT, PSV but not anticipate that she will have the ability to protect airway given neurological injury.  If consistent with her goals of care then she will need tracheostomy, PEG, chronic airway protection. Continue to follow off sedation  History of depression Sertraline and brexpiprazole as ordered  Nutrition Initiate tube feeding 4/2  Best practice:  Diet: NPO, start tube feeding 4/2 Pain/Anxiety/Delirium protocol (if indicated): Acetaminophen/fentanyl VAP protocol (if indicated): In place DVT prophylaxis: SCD GI prophylaxis: Protonix Glucose control: NA Mobility: Bedrest Code Status: Full code Family Communication: Dr Pearlean Brownie discussed with pt's mother 4/1.  Disposition: ICU.    Labs   CBC: Recent Labs  Lab 04/26/2018 2052  04/09/2018 1816 05/08/18 0502 05/09/18 0728  WBC 9.2  --  12.6* 10.9*  NEUTROABS 5.9  --  10.5*  --   HGB 14.5 13.3 12.9 12.7  HCT 45.2 39.0 41.1 38.9  MCV 93.2  --  93.4 94.2  PLT 231  --  217 156    Basic Metabolic Panel: Recent Labs  Lab 04/30/2018 2052 04/30/2018 1816 05/08/18 0502  NA 136 140 137  K 3.7 3.7 3.5  CL 107  --  106  CO2 22  --  22  GLUCOSE 110*  --  117*  BUN 12  --  <5*  CREATININE 0.65  --  0.62  CALCIUM 8.9  --  8.4*  MG  --   --  1.6*   GFR: Estimated Creatinine Clearance: 118.6 mL/min (by C-G formula based on SCr of 0.62 mg/dL). Recent Labs  Lab 04/20/2018 2052 05/08/18 0502 05/09/18 0728  WBC 9.2 12.6* 10.9*    Liver Function Tests: Recent Labs  Lab 04/26/2018 2052  AST 22  ALT 16  ALKPHOS 80  BILITOT 0.5  PROT 6.6  ALBUMIN 3.2*   No results for input(s): LIPASE, AMYLASE in the last 168 hours. No results for input(s): AMMONIA in the last 168 hours.  ABG    Component Value Date/Time   PHART 7.384 04/27/2018 1816   PCO2ART 30.8 (L) 04/25/2018 1816   PO2ART 88.0 04/15/2018 1816   HCO3 18.4 (L) 04/16/2018 1816   TCO2 19 (L) 05/05/2018 1816   ACIDBASEDEF 6.0 (H) 04/20/2018 1816   O2SAT 97.0 04/13/2018 1816     Coagulation Profile: Recent Labs  Lab 04/18/2018 0903  INR 1.1    Independent CC time 31 minutes  Levy Pupa, MD, PhD 05/09/2018, 9:13 AM [facility] [phone] or if no answer [phone]

## 2018-05-09 NOTE — Progress Notes (Signed)
STROKE TEAM PROGRESS NOTE   INTERVAL HISTORY Patient's neurological exam remains unchanged and she can communicate only with vertical eye movements and eye opening and closure. She continues to have quadriplegia. She has low-grade fever. Blood pressure adequately controlled.  Vitals:   05/09/18 0845 05/09/18 0900 05/09/18 0915 05/09/18 0930  BP: (!) 143/81 133/77 134/77 140/83  Pulse: 67 63 63 64  Resp: Temp:  100.2 F (37.9 C)    TempSrc:      SpO2: 99% 99% 99% 98%  Weight:      Height:        CBC:  Recent Labs  Lab 12-May-2018 2052  05/08/18 0502 05/09/18 0728  WBC 9.2  --  12.6* 10.9*  NEUTROABS 5.9  --  10.5*  --   HGB 14.5   < > 12.9 12.7  HCT 45.2   < > 41.1 38.9  MCV 93.2  --  93.4 94.2  PLT 231  --  217 156   < > = values in this interval not displayed.    Basic Metabolic Panel:  Recent Labs  Lab May 12, 2018 2052 04/26/2018 1816 05/08/18 0502  NA 136 140 137  K 3.7 3.7 3.5  CL 107  --  106  CO2 22  --  22  GLUCOSE 110*  --  117*  BUN 12  --  <5*  CREATININE 0.65  --  0.62  CALCIUM 8.9  --  8.4*  MG  --   --  1.6*   Lipid Panel:     Component Value Date/Time   CHOL 273 (H) 04/29/2018 0449   CHOL 227 (H) 02/25/2018 1047   TRIG 104 04/30/2018 2200   HDL 76 04/14/2018 0449   HDL 64 02/25/2018 1047   CHOLHDL 3.6 04/26/2018 0449   VLDL 18 04/14/2018 0449   LDLCALC 179 (H) 05/01/2018 0449   LDLCALC 147 (H) 02/25/2018 1047   HgbA1c:  Lab Results  Component Value Date   HGBA1C 5.1 04/24/2018   Urine Drug Screen:     Component Value Date/Time   LABOPIA NONE DETECTED 04/20/2018 1800   COCAINSCRNUR NONE DETECTED 04/14/2018 1800   LABBENZ NONE DETECTED 04/27/2018 1800   AMPHETMU NONE DETECTED 04/30/2018 1800   THCU NONE DETECTED 04/17/2018 1800   LABBARB NONE DETECTED 05/01/2018 1800    Alcohol Level No results found for: ETH  IMAGING Ct Head Wo Contrast  Result Date: 04/20/2018 CLINICAL DATA:  Stroke follow-up EXAM: CT HEAD WITHOUT  CONTRAST TECHNIQUE: Contiguous axial images were obtained from the base of the skull through the vertex without intravenous contrast. COMPARISON:  Head CT 12-May-2018 FINDINGS: Brain: Unchanged hypoattenuation in the cerebellar hemispheres compatible with infarcts of mixed ages as demonstrated on recent MRI. No intracranial hemorrhage. No midline shift or other mass effect. The supratentorial brain is unremarkable. Vascular: No abnormal hyperdensity of the major intracranial arteries or dural venous sinuses. No intracranial atherosclerosis. Skull: The visualized skull base, calvarium and extracranial soft tissues are normal. Sinuses/Orbits: No fluid levels or advanced mucosal thickening of the visualized paranasal sinuses. No mastoid or middle ear effusion. The orbits are normal. 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 No hemorrhage or mass effect. Electronically Signed   By: Deatra Robinson M.D.   On: 04/16/2018 20:42   Mr Maxine Glenn Head Wo Contrast  Result Date: 05/08/2018 CLINICAL DATA:  Initial evaluation for acute stroke. Patient with history of left vertebral artery dissection and occlusion, with distal right vertebral  artery occlusion, and basilar occlusion. Status post catheter directed revascularization and stenting. 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 EXAM: MRI HEAD WITHOUT CONTRAST MRA HEAD WITHOUT CONTRAST TECHNIQUE: Multiplanar, multiecho pulse sequences of the brain and surrounding structures were obtained without intravenous contrast. Angiographic images of the head were obtained using MRA technique without contrast. COMPARISON:  Comparison made with prior CTA and MRI from 04/25/2018. FINDINGS: MRI HEAD FINDINGS Brain: Examination mildly degraded by motion artifact. There has been interval evolution of multifocal ischemic infarcts involving the bilateral cerebellar hemispheres, which are now progressed and increased in size as compared to previous MRI. Degree of  infarction has most notably increased within the right cerebellum as compared to previous, predominantly involving the right superior cerebral artery territory. Confluent infarction involving the pons and right greater than left medulla have also increased. Few small new superimposed foci of susceptibility artifact involving the left greater than right cerebellum and brainstem consistent with associated petechial hemorrhage (series 18, images 12, 16, 20). Fourth ventricle is partially effaced as compared to previous exam but remains patent at this time. Stable ventricular size without hydrocephalus. Underlying chronic infarcts involving the cerebellum and left splenium again noted. Remainder the brain lesion or midline shift. No extra-axial fluid collection. Vascular: Abnormal flow voids within the right greater than left vertebral arteries, which could be related to slow flow and/or occlusion. Additional abnormal flow void within the basilar artery. Anterior intracranial flow voids maintained. Skull and upper cervical spine: Craniocervical junction normal. Upper cervical spine within normal limits. Bone marrow signal intensity normal. No scalp soft tissue abnormality. Sinuses/Orbits: Globes orbital soft tissues within normal limits. Scattered mucosal thickening within the ethmoidal air cells and maxillary sinuses. Fluid seen layering within the nasopharynx. Patient is intubated. No significant mastoid effusion. Inner ear structures normal. Other: None. MRA HEAD FINDINGS ANTERIOR CIRCULATION: Examination mildly degraded by motion artifact. Distal cervical segments of the internal carotid arteries are patent with antegrade flow. Petrous, cavernous, and supraclinoid segments patent without hemodynamically significant stenosis. A1 segments widely patent. Normal anterior communicating artery. Anterior cerebral arteries patent to their distal aspects without stenosis. M1 segments widely patent. Normal MCA bifurcations.  Distal MCA branches well perfused and symmetric. POSTERIOR CIRCULATION: No significant flow related signal seen within the right vertebral artery. No flow related signal seen within the left vertebral artery as it courses into the skull base. Minimal flow related signal within the left V4 segment near the expected takeoff of the left PICA, which could be due to collateralization (series 9, image 63). No flow seen within the left V4 segment distally. No flow related signal seen within the proximal and mid basilar artery. Right anterior inferior cerebral artery does appear to be patent proximally. Left anterior inferior cerebral artery not visualized. Flow related signal is seen within the distal basilar artery/basilar tip due to collateralization. Superior cerebral arteries are perfused bilaterally. Both posterior cerebral arteries supplied via the basilar as well as robust bilateral posterior communicating arteries. Right PCA is widely patent to its distal aspect. Attenuation of the distal left PCA as compared to the right, consistent with recently identified subacute to chronic left occipital and splenial infarcts. IMPRESSION: MRI HEAD IMPRESSION: 1. Interval evolution of multifocal acute ischemic infarcts involving the bilateral cerebellar hemispheres as well as the pons and brainstem, increased in size in severity as compared to previous MRI. Developing regional mass effect with mild partial effacement of the fourth ventricle as compared to previous exam. No hydrocephalus at this time. 2. Interval development  of small volume scattered petechial hemorrhage within both cerebral hemispheres as well as the pons. 3. Abnormal flow voids throughout the vertebrobasilar system, better evaluated on concomitant MRA as a below. MRA HEAD IMPRESSION: 1. Absent flow related signal within both vertebral arteries and basilar artery, concerning for occlusion overall, this has worsened from prior CTA, with no appreciable flow seen  within the right V4 segment on current exam (preserved on prior study). Distal reconstitution of the distal basilar artery via collateralization via the anterior circulation. Superior cerebellar and posterior cerebral arteries are perfused. 2. Attenuation of the distal left PCA, likely embolic, in consistent with previously identified subacute to chronic left splenial/occipital lobe infarcts. 3. Widely patent anterior circulation. Electronically Signed   By: Rise Mu M.D.   On: 05/08/2018 14:51   Mr Brain Wo Contrast  Result Date: 05/08/2018 CLINICAL DATA:  Initial evaluation for acute stroke. Patient with history of left vertebral artery dissection and occlusion, with distal right vertebral artery occlusion, and basilar occlusion. Status post catheter directed revascularization and stenting. 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 EXAM: MRI HEAD WITHOUT CONTRAST MRA HEAD WITHOUT CONTRAST TECHNIQUE: Multiplanar, multiecho pulse sequences of the brain and surrounding structures were obtained without intravenous contrast. Angiographic images of the head were obtained using MRA technique without contrast. COMPARISON:  Comparison made with prior CTA and MRI from 04/24/2018. FINDINGS: MRI HEAD FINDINGS Brain: Examination mildly degraded by motion artifact. There has been interval evolution of multifocal ischemic infarcts involving the bilateral cerebellar hemispheres, which are now progressed and increased in size as compared to previous MRI. Degree of infarction has most notably increased within the right cerebellum as compared to previous, predominantly involving the right superior cerebral artery territory. Confluent infarction involving the pons and right greater than left medulla have also increased. Few small new superimposed foci of susceptibility artifact involving the left greater than right cerebellum and brainstem consistent with associated petechial hemorrhage (series 18, images 12, 16, 20).  Fourth ventricle is partially effaced as compared to previous exam but remains patent at this time. Stable ventricular size without hydrocephalus. Underlying chronic infarcts involving the cerebellum and left splenium again noted. Remainder the brain lesion or midline shift. No extra-axial fluid collection. Vascular: Abnormal flow voids within the right greater than left vertebral arteries, which could be related to slow flow and/or occlusion. Additional abnormal flow void within the basilar artery. Anterior intracranial flow voids maintained. Skull and upper cervical spine: Craniocervical junction normal. Upper cervical spine within normal limits. Bone marrow signal intensity normal. No scalp soft tissue abnormality. Sinuses/Orbits: Globes orbital soft tissues within normal limits. Scattered mucosal thickening within the ethmoidal air cells and maxillary sinuses. Fluid seen layering within the nasopharynx. Patient is intubated. No significant mastoid effusion. Inner ear structures normal. Other: None. MRA HEAD FINDINGS ANTERIOR CIRCULATION: Examination mildly degraded by motion artifact. Distal cervical segments of the internal carotid arteries are patent with antegrade flow. Petrous, cavernous, and supraclinoid segments patent without hemodynamically significant stenosis. A1 segments widely patent. Normal anterior communicating artery. Anterior cerebral arteries patent to their distal aspects without stenosis. M1 segments widely patent. Normal MCA bifurcations. Distal MCA branches well perfused and symmetric. POSTERIOR CIRCULATION: No significant flow related signal seen within the right vertebral artery. No flow related signal seen within the left vertebral artery as it courses into the skull base. Minimal flow related signal within the left V4 segment near the expected takeoff of the left PICA, which could be due to collateralization (series 9, image 63).  No flow seen within the left V4 segment distally. No flow  related signal seen within the proximal and mid basilar artery. Right anterior inferior cerebral artery does appear to be patent proximally. Left anterior inferior cerebral artery not visualized. Flow related signal is seen within the distal basilar artery/basilar tip due to collateralization. Superior cerebral arteries are perfused bilaterally. Both posterior cerebral arteries supplied via the basilar as well as robust bilateral posterior communicating arteries. Right PCA is widely patent to its distal aspect. Attenuation of the distal left PCA as compared to the right, consistent with recently identified subacute to chronic left occipital and splenial infarcts. IMPRESSION: MRI HEAD IMPRESSION: 1. Interval evolution of multifocal acute ischemic infarcts involving the bilateral cerebellar hemispheres as well as the pons and brainstem, increased in size in severity as compared to previous MRI. Developing regional mass effect with mild partial effacement of the fourth ventricle as compared to previous exam. No hydrocephalus at this time. 2. Interval development of small volume scattered petechial hemorrhage within both cerebral hemispheres as well as the pons. 3. Abnormal flow voids throughout the vertebrobasilar system, better evaluated on concomitant MRA as a below. MRA HEAD IMPRESSION: 1. Absent flow related signal within both vertebral arteries and basilar artery, concerning for occlusion overall, this has worsened from prior CTA, with no appreciable flow seen within the right V4 segment on current exam (preserved on prior study). Distal reconstitution of the distal basilar artery via collateralization via the anterior circulation. Superior cerebellar and posterior cerebral arteries are perfused. 2. Attenuation of the distal left PCA, likely embolic, in consistent with previously identified subacute to chronic left splenial/occipital lobe infarcts. 3. Widely patent anterior circulation. Electronically Signed   By:  Rise Mu M.D.   On: 05/08/2018 14:51   Dg Chest Portable 1 View  Result Date: 05/01/2018 CLINICAL DATA:  Intubation EXAM: PORTABLE CHEST 1 VIEW COMPARISON:  Portable exam 1707 hours compared to 05/03/2018 at 0610 hours FINDINGS: Tip of endotracheal tube projects 4.0 cm above carina. Normal heart size, mediastinal contours, and pulmonary vascularity. Lungs clear. No definite infiltrate, pleural effusion or pneumothorax. IMPRESSION: No acute abnormalities. Electronically Signed   By: Ulyses Southward M.D.   On: 04/15/2018 17:44   Dg Abd Portable 1v  Result Date: 04/08/2018 CLINICAL DATA:  Orogastric tube placement EXAM: PORTABLE ABDOMEN - 1 VIEW COMPARISON:  None. FINDINGS: The side port of the orogastric tube projects over the gastric body. The tip is excluded view. Visualized right lung base is clear. IMPRESSION: Orogastric tube side port overlying the gastric body. Electronically Signed   By: Deatra Robinson M.D.   On: 04/23/2018 19:30   Cerebral angiogram 05/03/2018 S/P Rt vert arteriogram followed by complete revascularization of  Entire basilar  Artery with x 2 passes with 85mm x 47mm embotrap retrieverdevice and x 1 pass with 57mm x 40 mm solitaire FR retriever device achieving a TICI 3 revascularization and placement od enterprise stent atsite of flow limiting stenosis in the prox bailar artery with a TICI 3 revascularization  2D Echocardiogram   1. The left ventricle has normal systolic function with an ejection fraction of 60-65%. The cavity size was normal. Left ventricular diastolic parameters were normal.  2. The right ventricle has normal systolic function. The cavity was normal. There is no increase in right ventricular wall thickness.  3. Agitated saline exam indeterminate due to image quality, however no large right to left shunt seen.  4. No intracardiac mass or thrombi identified. No intracardiac source of  emboli identified.   PHYSICAL EXAM: Young Caucasian lady was intubated  and not sedated . Afebrile. Head is nontraumatic. Neck is supple without bruit.    Cardiac exam no murmur or gallop. Lungs are clear to auscultation. Distal pulses are well felt. Neurological Exam :  Patient is  Awake with eyes open. She will follow commands only with vertical eye movements and opening and closing her eyes. 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 She is unable to protrude her tongue. She has very weak cough and gag. She has quadriplegia and is unable to move her hands and feet voluntarily. She is does withdrawal semi-purposefully briskly in the lower extremities and minimally in both upper extremities. Plantars downgoing. Gait not tested.   ASSESSMENT/PLAN Erika Hernandez is a 28 y.o. female with no significant PMH presenting to [facility] with acute onset slurred speech followed by diplopia and L sided numbness.     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 However presence of old cerebellar infarcts raises possibility of occlusive posterior circulation disease with acute on chronic occlusion. S/p IR w/ TICI3 revascularization BA w/ stent placement. 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 Post BA stent w/ initial improvement but then neuro worsening. Intubated. MRI shows increased stroke. 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   CT head patchy B cerebellar L>R hypodensity  MRI head multifocial acute, subacute and chornic posterior circulation infarcts. Attenuated L VA and BA w/ slow flow, occlusion  MRI CS L VA slow flow vs occlusion, o/w neg  CTA head occluded L and distal R VA and VBJ. Occluded BA w/ reconstitution basilar tip and complete COW.  CTA neck occluded L VA. Patent R VA. Normal  ICAs  Cerebral angio TICI3 revascularization BA w/ stent placement following admission  Post IR CT no ICH or mass effect. No hydrocephalus. Ventricles symmetric.   CT head unchanged B cerebellar infarcts of mixed ages  Repeat MRI interval evolution of a multifocal posterior cerebral circulation infarcts involving bilateral cerebellar hemispheres as well as pons which appear to have increased compared with previous MRI from day prior  Repeat MRA head shows normal flow in both distal vertebral arteries as well as basilar arteries suggesting likely occlusion of her basilar stent.  2D Echo bubble EF 60-65%. No source of embolus. Neg bubble  LDL 179  HgbA1c 5.1  SCDs for VTE prophylaxis. Add Heparin 5000 units sq tid   No antithrombotic prior to admission, now on aspirin 81 mg daily and Brilinta (ticagrelor) 90 mg bid.continue Brilinta x 30 days along with aspirin at discharge then change to plavix and aspirin.  Therapy recommendations:  pending   Disposition:  pending .   Brother reaching out to the [employer] from [employer] about traveling to see her. The RN will notify us once this occurs and letter needed can be provided.  Respiratory Failure  Compromised airway following IR and BA stent   Intubated on 4N  Sedated   Will need early trach  CCM onboard  Blood Pressure  Treated post IR w/ cleviprex drip, now off  BP 130-140s  Dysphagia  Secondary to stroke  NPO  Meds per tube  Starting TF  Will need early PEG  Hyperlipidemia  Home meds:  No statin  LDL 179, goal < 70  Add statin - lipitor 80  Continue statin at discharge  Other Stroke Risk Factors  ETOH use, advised to drink no more than 1 drink(s) a day  Overweight  Body mass index is 28.53 kg/m., recommend weight loss, diet and exercise as appropriate   Other Active Problems  Anxiety/depression, OCD on zoloft and rexulti  CXR neg  Hospital day # 2  I have personally obtained  history,examined this patient, reviewed notes, independently viewed imaging studies, participated in medical decision making and plan of care.ROS completed by me personally and pertinent positives fully documented  I have made any additions or clarifications directly to the above note.     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 I spoke over the phone with the patient's mother and aunt and updated them about her condition. She will likely need early tracheostomy and PEG tube but mother wants some time to think about this and discuss with her son. She also wants me to discuss her case with her psychiatrist Dr. Derrek Gu This patient is critically ill and at significant risk of neurological worsening, death and care requires constant monitoring of vital signs, hemodynamics,respiratory and cardiac monitoring, extensive review of multiple databases, frequent neurological assessment, discussion with family, other specialists and medical decision making of high complexity.I have made any additions or clarifications directly to the above note.This critical care time does not reflect procedure time, or teaching time or supervisory time of PA/NP/Med Resident etc but could involve care discussion time.  I spent 35 minutes of neurocritical care time  in the care of  this patient.   Delia Heady, MD Medical Director [facility] Pager: [phone] 05/09/2018 9:39 AM   To contact Stroke Continuity provider, please refer to [email or web address]. After hours, contact General Neurology

## 2018-05-09 NOTE — Progress Notes (Signed)
Chaplain called mother, Erika Hernandez.  Also on the phone was another person, [place].  Chaplain provided ministry of listening and support to mother.  Chaplain prayed with mother and Beth on phone.  Mother requested that we provided praise music for patient to listen to and to read from her Bible. Mother explained that pt's brother is trying to travel from [place].  She (the mother) had her doctor speak to hospital doctor's to understand patient's condition so that she makes the right decisions.  She wants to know if the patient is trached and pegged if it can be reversed at the family's decision.  Chaplain offered pt's mothers questions to the nurse on duty Asher Muir) for further discussion.  Will continue to be available. Rev. Lynnell Chad Pager [phone]

## 2018-05-10 DIAGNOSIS — Z515 Encounter for palliative care: Secondary | ICD-10-CM

## 2018-05-10 DIAGNOSIS — Z7189 Other specified counseling: Secondary | ICD-10-CM

## 2018-05-10 LAB — GLUCOSE, CAPILLARY
Glucose-Capillary: 118 mg/dL — ABNORMAL HIGH (ref 70–99)
Glucose-Capillary: 124 mg/dL — ABNORMAL HIGH (ref 70–99)
Glucose-Capillary: 114 mg/dL — ABNORMAL HIGH (ref 70–99)
Glucose-Capillary: 114 mg/dL — ABNORMAL HIGH (ref 70–99)
Glucose-Capillary: 117 mg/dL — ABNORMAL HIGH (ref 70–99)
Glucose-Capillary: 110 mg/dL — ABNORMAL HIGH (ref 70–99)

## 2018-05-10 LAB — BASIC METABOLIC PANEL
Anion gap: 10 (ref 5–15)
BUN: 14 mg/dL (ref 6–20)
CO2: 26 mmol/L (ref 22–32)
Calcium: 8.2 mg/dL — ABNORMAL LOW (ref 8.9–10.3)
Chloride: 104 mmol/L (ref 98–111)
Creatinine, Ser: 0.53 mg/dL (ref 0.44–1.00)
GFR calc Af Amer: >60 mL/min (ref >60)
GFR calc non Af Amer: >60 mL/min (ref >60)
Glucose, Bld: 131 mg/dL — ABNORMAL HIGH (ref 70–99)
Potassium: 3.7 mmol/L (ref 3.5–5.1)
Sodium: 140 mmol/L (ref 135–145)

## 2018-05-10 LAB — CBC
HCT: 39.2 % (ref 36.0–46.0)
Hemoglobin: 12.3 g/dL (ref 12.0–15.0)
MCH: 29.7 pg (ref 26.0–34.0)
MCHC: 31.4 g/dL (ref 30.0–36.0)
MCV: 94.7 fL (ref 80.0–100.0)
Platelets: 166 10*3/uL (ref 150–400)
RBC: 4.14 MIL/uL (ref 3.87–5.11)
RDW: 12.4 % (ref 11.5–15.5)
WBC: 10.4 10*3/uL (ref 4.0–10.5)
nRBC: 0 % (ref 0.0–0.2)

## 2018-05-10 LAB — MAGNESIUM: Magnesium: 1.8 mg/dL (ref 1.7–2.4)

## 2018-05-10 MED ORDER — PANTOPRAZOLE SODIUM 40 MG PO PACK
40.0000 mg | PACK | Freq: Every day | ORAL | Status: DC
  Filled 2018-05-10: qty 20

## 2018-05-10 NOTE — Progress Notes (Signed)
NAME:  Erika Hernandez, MRN:  [id number], DOB:  06-Jul-1990, LOS: 3 ADMISSION DATE:  04/21/2018, CONSULTATION DATE:  05/23/18 REFERRING MD: Dr. 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  Admitted to ICU, failed medical treatment with heparin gtt with progressive neurological decline and taken to IR with successful revascularization and stent placement.  Failed extubation in PACU and intubated for airway protection.  History of present illness    28 year old female with history of depression, presenting 3/30 to [facility] with several day history of neck pain.  No reported injury prior to. LSW 3/30 around 1500.  Developed slurred speech followed by left numbness, left gaze, diplopa No reported injury. Initial CT showed cerebellar hypodensity.  Was transferred to [facility], Neurology consulted.  CTA showed left vertebral occlusion, likely dissection as well as basilar occlusion with subacute bilateral cerebellar infarcts.  She was admitted to Neurology and ICU and placed on heparin.  Throughout the day, patient had progressive neurological decline.  The decision was made to take patient for endovascular repair.  Patient taken to IR with complete revascularization of right vertebral artery and revascularization of basilar artery with stent placement.  Was extubated in PACU however patient unable to protect airway and therefore reintubated.  Sent to ICU, PCCM consulted for ventilator management.   Past Medical History  Depression  Significant Hospital Events   3/30 Admit 3/31 IR/ intubated   Consults:  IR PCCM  Procedures:  3/31 cerebral angiogram/ IR 3/31 ETT >>  Significant Diagnostic Tests:   3/30 CTH >>  1. No acute intracranial hemorrhage. 2. Patchy areas of hypodensity in the cerebellar  hemispheres bilaterally, left greater right, most consistent with areas of infarct of indeterminate age. Acute infarct is not excluded.  3/31 MRI brain/ cervical >> 1. 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 2. Attenuated LEFT vertebral artery and basilar artery seen with slow flow or occlusion. 1. Slow flow versus occluded LEFT vertebral artery. 2. Otherwise negative MRI cervical spine with and without contrast.  3/31 CTA head/ neck >> 1. 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 Patent RIGHT vertebral artery. 2. Normal bilateral ICA's. 1. 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  3/31 CT head >> unchanged bilateral cerebellar infarcts of mid stages, no bleed, no mass-effect  4/1 MR I/MRA brain >> interval evolution multifocal ischemic infarcts involving bilateral cerebellar hemispheres, progressed, larger.  Interval development small volume scattered petechial hemorrhage bilateral cerebral hemispheres and pons.  Partial effacement fourth ventricle.  Abnormal flow right greater than left vertebral arteries, basilar artery.  Micro Data:  3/31 MRSA PCR > neg  Antimicrobials:  None  Interim history/subjective:  Low-grade fever, less than 100 F reported last 24 hours No clinical or neurological change  Objective   Blood pressure 134/84, pulse 62, temperature 99.1 F (37.3 C), temperature source Oral, resp. rate 20, height  (1.727 m), weight 87 kg, last menstrual period 05/01/2018, SpO2 100 %.    Vent Mode: PSV;CPAP FiO2 (%):  [40 %] 40 % Set Rate:  [12 bmp] 12 bmp Vt Set:  [510 mL] 510 mL PEEP:  [5 cmH20] 5 cmH20 Pressure Support:  [15 cmH20-20 cmH20] 15 cmH20 Rada Zegersssure:  [15 cmH20-19 cmH20] 15  cmH20   Intake/Output Summary (Last 24 hours) at 05/10/2018 4270 Last  data filed at 05/10/2018 0600 Gross per 24 hour  Intake 625.4 ml  Output 1050 ml  Net -424.6 ml   Filed Weights   2018-05-29 2038 04/26/2018 0520 05/10/18 0500  Weight: 83.9 kg 85.1 kg 87 kg    Examination: General: Ill-appearing young woman, ventilated, awake HENT: ET tube in place, OG tube in place, no icterus Lungs: Clear bilaterally, decreased posterior laterally, no wheeze, no crackles Cardiovascular: Regular, no murmur Abdomen: Soft, nondistended, positive bowel sounds Extremities: No significant edema Neuro: Eyes are open.  She could communicate with eye movements and blinking.  Withdraws all 4 extremities to pain.  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 Unfortunately suspect that she will be profoundly debilitated, pended for care going forward  Hypertension Cleviprex off, discontinued from [facility] on 4/2 Can add enteral blood pressure control if necessary, not currently requiring  Acute respiratory failure, principally due to poor airway protection Okay to continue SBT as she can tolerate.  Currently on PS 15.  She reportedly did PS 24 1 hour on 4/2.  I do not believe she will be able to fully protect her airway given her neurological injury so no plans to extubate.  If consistent with her goals for care then she will need tracheostomy, chronic airway protection. This is still up for discussion with the patient's family.  Her mother is waiting for the patient's brother to arrive from out of town so that he can participate in the discussion, communicate with her and the patient.  Following off sedation  History of depression Sertraline and brexpiprazole as ordered  Nutrition Continue tube feeding  Best practice:  Diet: NPO, start tube feeding 4/2 Pain/Anxiety/Delirium protocol (if indicated): Acetaminophen/fentanyl VAP protocol (if indicated): In place DVT prophylaxis: SCD GI prophylaxis: Protonix Glucose control: NA Mobility: Bedrest Code Status: Full code Family Communication: Dr Pearlean Brownie discussed with pt's mother 4/1.  Await arrival of patient's brother from [place] Disposition: ICU.    Labs   CBC: Recent Labs  Lab May 29, 2018 2052 04/24/2018 1816 05/08/18 0502 05/09/18 0728 05/10/18 0255  WBC 9.2  --  12.6* 10.9* 10.4  NEUTROABS 5.9  --  10.5*  --   --   HGB 14.5 13.3 12.9 12.7 12.3  HCT 45.2 39.0 41.1 38.9 39.2  MCV 93.2  --  93.4 94.2 94.7  PLT 231  --  217 156 166    Basic Metabolic Panel: Recent Labs  Lab 2018-05-29 2052 05/02/2018 1816 05/08/18 0502 05/10/18 0255  NA 136 140 137 140  K 3.7 3.7 3.5 3.7  CL 107  --  106 104  CO2 22  --  22 26  GLUCOSE 110*  --  117* 131*  BUN 12  --  <5* 14  CREATININE 0.65  --  0.62 0.53  CALCIUM 8.9  --  8.4* 8.2*  MG  --   --  1.6* 1.8   GFR: Estimated Creatinine Clearance: 119.7 mL/min (by C-G formula based on SCr of 0.53 mg/dL). Recent Labs  Lab May 29, 2018 2052 05/08/18 0502 05/09/18 0728 05/10/18 0255  WBC 9.2 12.6* 10.9* 10.4  Liver Function Tests: Recent Labs  Lab 04/26/2018 2052  AST 22  ALT 16  ALKPHOS 80  BILITOT 0.5  PROT 6.6  ALBUMIN 3.2*   No results for input(s): LIPASE, AMYLASE in the last 168 hours. No results for input(s): AMMONIA in the last 168 hours.  ABG    Component Value Date/Time   PHART 7.384 2018/05/23 1816   PCO2ART 30.8 (L) 05/23/2018 1816   PO2ART 88.0 May 23, 2018 1816   HCO3 18.4 (L) 05/23/18 1816   TCO2 19 (L) 2018-05-23 1816   ACIDBASEDEF 6.0 (H) 05-23-18 1816   O2SAT 97.0 May 23, 2018 1816     Coagulation Profile: Recent Labs  Lab  May 23, 2018 0903  INR 1.1    Levy Pupa, MD, PhD 05/10/2018, 8:22 AM [facility] [phone] or if no answer [phone]

## 2018-05-10 NOTE — Progress Notes (Signed)
Physical Therapy Treatment Patient Details Name: Erika Hernandez MRN: [id number] DOB: 1990/06/07 Today's Date: 05/10/2018    History of Present Illness 28 year old Caucasian lady with no significant past neurological history with subacute headache neck pain as well as worsening neurlogical symptoms of headache, gait ataxia, slurred speech, numbness  and diplopia. She was started on IV heparin and has remained stable overnight. 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  She denies any significant head injury, motor vehicle accident, repetitive neck jerking movements, chiropractic manipulation to suggest dissection.    PT Comments    Pt was able to maintain attention for short periods, waning at over 15 secs.  She was able to answer simple questions reliably with up/down eye movements.  She tolerated sitting upright for a short period of time (19-15 min, but respiratory rate rose into the 50's, pt looked in distress and HOB lowered.  Pt tearful and nodded she was sad.    Follow Up Recommendations  LTACH     Equipment Recommendations  Other (comment)(TBA)    Recommendations for Other Services       Precautions / Restrictions Precautions Precautions: Fall Precaution Comments: ETT     Mobility  Bed Mobility                  Transfers                    Ambulation/Gait                 Stairs             Wheelchair Mobility    Modified Rankin (Stroke Patients Only)       Balance Overall balance assessment: Needs assistance   Sitting balance-Leahy Scale: Zero Sitting balance - Comments: Pt moved into partial chair position.                                       Cognition Arousal/Alertness:  Awake/alert;Lethargic Behavior During Therapy: Flat affect;WFL for tasks assessed/performed Overall Cognitive Status: Difficult to assess                                 General Comments: Answering yes/no questions with eye movements and accurate ~80% of time. Pt       Exercises Other Exercises Other Exercises: PROM performed all 4 extremeties.    Other Exercises: Pt with head/neck rotated to the Lt, ROM performed to the Rt with full ROM achieved.  when pt moved into partial chair position, she was able to maintain head/neck in neutral for brief period of time.  With significant effort, she was able to perform small excursion of active ROM of neck on command x 2     General Comments General comments (skin integrity, edema, etc.): vss      Pertinent Vitals/Pain Pain Assessment: Faces Faces Pain Scale: No hurt Pain Intervention(s): Monitored during session    Home Living Family/patient expects to be discharged to:: Unsure               Additional Comments: will likely need LTACH vs long term SNF     Prior Function Level of  Independence: Independent          PT Goals (current goals can now be found in the care plan section) Acute Rehab PT Goals PT Goal Formulation: Patient unable to participate in goal setting Time For Goal Achievement: 05/22/18 Potential to Achieve Goals: Poor Progress towards PT goals: (little progress to note)    Frequency    Min 3X/week      PT Plan Current plan remains appropriate    Co-evaluation PT/OT/SLP Co-Evaluation/Treatment: Yes Reason for Co-Treatment: Complexity of the patient's impairments (multi-system involvement) PT goals addressed during session: Strengthening/ROM OT goals addressed during session: Strengthening/ROM      AM-PAC PT "6 Clicks" Mobility   Outcome Measure  Help needed turning from your back to your side while in a flat bed without using bedrails?: Total Help needed moving from lying on your  back to sitting on the side of a flat bed without using bedrails?: Total Help needed moving to and from a bed to a chair (including a wheelchair)?: Total Help needed standing up from a chair using your arms (e.g., wheelchair or bedside chair)?: Total Help needed to walk in hospital room?: Total Help needed climbing 3-5 steps with a railing? : Total 6 Click Score: 6    End of Session   Activity Tolerance: Patient tolerated treatment well Patient left: in bed;with call bell/phone within reach;with bed alarm set Nurse Communication: Mobility status PT Visit Diagnosis: Other (comment);Pain;Other symptoms and signs involving the nervous system (O03.704)     Time: 1202-1228 PT Time Calculation (min) (ACUTE ONLY): 26 min  Charges:  $Therapeutic Activity: 8-22 mins                     05/10/2018  Piketon Bing, PT Acute Rehabilitation Services [phone]  (pager) [phone]  (office)   [name] 05/10/2018, 2:30 PM

## 2018-05-10 NOTE — Progress Notes (Signed)
SLP Cancellation Note  Patient Details Name: Erika Hernandez MRN: [id number] DOB: Oct 25, 1990   Cancelled treatment:        Pt on ventilator. Will follow.   Royce Macadamia 05/10/2018, 8:06 AM   Breck Coons Lonell Face.Ed Nurse, children's [phone] Office [phone]

## 2018-05-10 NOTE — Progress Notes (Signed)
STROKE TEAM ADDENDUM  I met the patient's mother and brother in person upon their request and explained her neurological presentation, treatment and prognosis and answered questions. They understand that she is locked in state and they have spoken to the patient who has expressed to them her desire not to have prolonged ventilatory support, tracheostomy, PEG tube and nursing home care. They are still struggling with the decision but agree to DO NOT RESUSCITATE and would like to meet with palliative care team's to make her final decision soon. Palliative care team has been consulted.  Antony Contras, MD Medical Director [facility] Pager: [phone] 05/10/2018 4:56 PM

## 2018-05-10 NOTE — Progress Notes (Signed)
Occupational Therapy Treatment Patient Details Name: Erika Hernandez MRN: [id number] DOB: December 20, 1990 Today's Date: 05/10/2018    History of present illness 28 year old Caucasian lady with no significant past neurological history with subacute headache neck pain as well as worsening neurlogical symptoms of headache, gait ataxia, slurred speech, numbness  and diplopia. She was started on IV heparin and has remained stable overnight. 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  She denies any significant head injury, motor vehicle accident, repetitive neck jerking movements, chiropractic manipulation to suggest dissection.   OT comments  Pt seen in conjunction with PT.  Pt lethargic today, but will sustain attention for up to 15-30 seconds and will answer simple questions with vertical eye movements.  She demonstrates extensor spasticity bil. UEs, but able to achieve full PROM.  She maintains head/neck rotated to the Lt.  She was moved into partial chair position, and was able to maintain head/neck in midline for brief period.  Once pt was in good alignment, she was able to initiate small excursion of head/neck movement actively x 2, but this required significant effort from pt.  She was noted to be tearful at end of session, and indicates she is "sad" when asked.   Will continue to follow.   Follow Up Recommendations  LTACH    Equipment Recommendations  None recommended by OT    Recommendations for Other Services      Precautions / Restrictions Precautions Precautions: Fall Precaution Comments: ETT        Mobility Bed Mobility                  Transfers                      Balance Overall balance assessment: Needs assistance   Sitting  balance-Leahy Scale: Zero Sitting balance - Comments: Pt moved into partial chair position.                                      ADL either performed or assessed with clinical judgement   ADL                                         General ADL Comments: Pt requires total A for all aspects      Vision   Additional Comments: Pt able to perform vertical eye movements    Perception     Praxis      Cognition Arousal/Alertness: Awake/alert;Lethargic Behavior During Therapy: Flat affect;WFL for tasks assessed/performed Overall Cognitive Status: Difficult to assess                                 General Comments: Answering yes/no questions with eye movements and accurate ~80% of time. Pt         Exercises Other Exercises Other Exercises: PROM performed all 4 extremeties.    Other Exercises: Pt with head/neck rotated to the Lt, ROM performed to the Rt with full ROM achieved.  when pt moved into partial chair position, she was able to maintain head/neck  in neutral for brief period of time.  With significant effort, she was able to perform small excursion of active ROM of neck on command x 2    Shoulder Instructions       General Comments VSS.     Pertinent Vitals/ Pain       Pain Assessment: Faces Faces Pain Scale: No hurt Pain Intervention(s): Monitored during session  Home Living Family/patient expects to be discharged to:: Unsure                                 Additional Comments: will likely need LTACH vs long term SNF       Prior Functioning/Environment Level of Independence: Independent            Frequency  Min 2X/week        Progress Toward Goals  OT Goals(current goals can now be found in the care plan section)  Progress towards OT goals: Progressing toward goals     Plan Discharge plan remains appropriate    Co-evaluation    PT/OT/SLP Co-Evaluation/Treatment: Yes Reason for  Co-Treatment: Complexity of the patient's impairments (multi-system involvement);For patient/therapist safety   OT goals addressed during session: Strengthening/ROM      AM-PAC OT "6 Clicks" Daily Activity     Outcome Measure   Help from another person eating meals?: Total Help from another person taking care of personal grooming?: Total Help from another person toileting, which includes using toliet, bedpan, or urinal?: Total Help from another person bathing (including washing, rinsing, drying)?: Total Help from another person to put on and taking off regular upper body clothing?: Total Help from another person to put on and taking off regular lower body clothing?: Total 6 Click Score: 6    End of Session Equipment Utilized During Treatment: Oxygen(ETT)  OT Visit Diagnosis: Unsteadiness on feet (R26.81);Other abnormalities of gait and mobility (R26.89);Muscle weakness (generalized) (M62.81);Other symptoms and signs involving cognitive function;Low vision, both eyes (H54.2)   Activity Tolerance Patient limited by fatigue   Patient Left in bed;with call bell/phone within reach   Nurse Communication Mobility status        Time: 1202-1228 OT Time Calculation (min): 26 min  Charges: OT General Charges $OT Visit: 1 Visit OT Treatments $Neuromuscular Re-education: 8-22 mins  Jeani Hawking, OTR/L Acute Rehabilitation Services Pager [phone] Office [phone]    Jeani Hawking M 05/10/2018, 2:13 PM

## 2018-05-10 NOTE — Progress Notes (Signed)
STROKE TEAM PROGRESS NOTE   INTERVAL HISTORY Patient remains neurologically unchanged. She is still able to communicate with vertical eye movements and opening and closing eyelids only. She remains quadriplegic. Vital signs appear stable. Family still deciding about trach and peg.  Vitals:   05/10/18 0930 05/10/18 0945 05/10/18 1000 05/10/18 1015  BP: 139/86 (!) 149/90 138/88 (!) 142/87  Pulse: 62 (!) 115 62 67  Resp: 18 20 19  (!) 22  Temp:      TempSrc:      SpO2: 100% 100% 99% 100%  Weight:      Height:        CBC:  Recent Labs  Lab 04/15/2018 2052  05/08/18 0502 05/09/18 0728 05/10/18 0255  WBC 9.2  --  12.6* 10.9* 10.4  NEUTROABS 5.9  --  10.5*  --   --   HGB 14.5   < > 12.9 12.7 12.3  HCT 45.2   < > 41.1 38.9 39.2  MCV 93.2  --  93.4 94.2 94.7  PLT 231  --  217 156 166   < > = values in this interval not displayed.    Basic Metabolic Panel:  Recent Labs  Lab 05/08/18 0502 05/10/18 0255  NA 137 140  K 3.5 3.7  CL 106 104  CO2 22 26  GLUCOSE 117* 131*  BUN <5* 14  CREATININE 0.62 0.53  CALCIUM 8.4* 8.2*  MG 1.6* 1.8   Lipid Panel:     Component Value Date/Time   CHOL 273 (H) January 17, 2019 0449   CHOL 227 (H) 02/25/2018 1047   TRIG 104 January 17, 2019 2200   HDL 76 January 17, 2019 0449   HDL 64 02/25/2018 1047   CHOLHDL 3.6 January 17, 2019 0449   VLDL 18 January 17, 2019 0449   LDLCALC 179 (H) January 17, 2019 0449   LDLCALC 147 (H) 02/25/2018 1047   HgbA1c:  Lab Results  Component Value Date   HGBA1C 5.1 January 17, 2019   Urine Drug Screen:     Component Value Date/Time   LABOPIA NONE DETECTED January 17, 2019 1800   COCAINSCRNUR NONE DETECTED January 17, 2019 1800   LABBENZ NONE DETECTED January 17, 2019 1800   AMPHETMU NONE DETECTED January 17, 2019 1800   THCU NONE DETECTED January 17, 2019 1800   LABBARB NONE DETECTED January 17, 2019 1800    Alcohol Level No results found for: [facility]  IMAGING Mr Maxine GlennMra Head Wo Contrast  Result Date: 05/08/2018 CLINICAL DATA:  Initial evaluation for acute stroke. Patient  with history of left vertebral artery dissection and occlusion, with distal right vertebral artery occlusion, and basilar occlusion. Status post catheter directed revascularization and stenting. 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 EXAM: MRI HEAD WITHOUT CONTRAST MRA HEAD WITHOUT CONTRAST TECHNIQUE: Multiplanar, multiecho pulse sequences of the brain and surrounding structures were obtained without intravenous contrast. Angiographic images of the head were obtained using MRA technique without contrast. COMPARISON:  Comparison made with prior CTA and MRI from January 17, 2019. FINDINGS: MRI HEAD FINDINGS Brain: Examination mildly degraded by motion artifact. There has been interval evolution of multifocal ischemic infarcts involving the bilateral cerebellar hemispheres, which are now progressed and increased in size as compared to previous MRI. Degree of infarction has most notably increased within the right cerebellum as compared to previous, predominantly involving the right superior cerebral artery territory. Confluent infarction involving the pons and right greater than left medulla have also increased. Few small new superimposed foci of susceptibility artifact involving the left greater than right cerebellum and brainstem consistent with associated petechial hemorrhage (series 18, images 12, 16, 20). Fourth ventricle  is partially effaced as compared to previous exam but remains patent at this time. Stable ventricular size without hydrocephalus. Underlying chronic infarcts involving the cerebellum and left splenium again noted. Remainder the brain lesion or midline shift. No extra-axial fluid collection. Vascular: Abnormal flow voids within the right greater than left vertebral arteries, which could be related to slow flow and/or occlusion. Additional abnormal flow void within the basilar artery. Anterior intracranial flow voids maintained. Skull and upper cervical spine: Craniocervical junction normal. Upper  cervical spine within normal limits. Bone marrow signal intensity normal. No scalp soft tissue abnormality. Sinuses/Orbits: Globes orbital soft tissues within normal limits. Scattered mucosal thickening within the ethmoidal air cells and maxillary sinuses. Fluid seen layering within the nasopharynx. Patient is intubated. No significant mastoid effusion. Inner ear structures normal. Other: None. MRA HEAD FINDINGS ANTERIOR CIRCULATION: Examination mildly degraded by motion artifact. Distal cervical segments of the internal carotid arteries are patent with antegrade flow. Petrous, cavernous, and supraclinoid segments patent without hemodynamically significant stenosis. A1 segments widely patent. Normal anterior communicating artery. Anterior cerebral arteries patent to their distal aspects without stenosis. M1 segments widely patent. Normal MCA bifurcations. Distal MCA branches well perfused and symmetric. POSTERIOR CIRCULATION: No significant flow related signal seen within the right vertebral artery. No flow related signal seen within the left vertebral artery as it courses into the skull base. Minimal flow related signal within the left V4 segment near the expected takeoff of the left PICA, which could be due to collateralization (series 9, image 63). No flow seen within the left V4 segment distally. No flow related signal seen within the proximal and mid basilar artery. Right anterior inferior cerebral artery does appear to be patent proximally. Left anterior inferior cerebral artery not visualized. Flow related signal is seen within the distal basilar artery/basilar tip due to collateralization. Superior cerebral arteries are perfused bilaterally. Both posterior cerebral arteries supplied via the basilar as well as robust bilateral posterior communicating arteries. Right PCA is widely patent to its distal aspect. Attenuation of the distal left PCA as compared to the right, consistent with recently identified  subacute to chronic left occipital and splenial infarcts. IMPRESSION: MRI HEAD IMPRESSION: 1. Interval evolution of multifocal acute ischemic infarcts involving the bilateral cerebellar hemispheres as well as the pons and brainstem, increased in size in severity as compared to previous MRI. Developing regional mass effect with mild partial effacement of the fourth ventricle as compared to previous exam. No hydrocephalus at this time. 2. Interval development of small volume scattered petechial hemorrhage within both cerebral hemispheres as well as the pons. 3. Abnormal flow voids throughout the vertebrobasilar system, better evaluated on concomitant MRA as a below. MRA HEAD IMPRESSION: 1. Absent flow related signal within both vertebral arteries and basilar artery, concerning for occlusion overall, this has worsened from prior CTA, with no appreciable flow seen within the right V4 segment on current exam (preserved on prior study). Distal reconstitution of the distal basilar artery via collateralization via the anterior circulation. Superior cerebellar and posterior cerebral arteries are perfused. 2. Attenuation of the distal left PCA, likely embolic, in consistent with previously identified subacute to chronic left splenial/occipital lobe infarcts. 3. Widely patent anterior circulation. Electronically Signed   By: Rise Mu M.D.   On: 05/08/2018 14:51   Mr Brain Wo Contrast  Result Date: 05/08/2018 CLINICAL DATA:  Initial evaluation for acute stroke. Patient with history of left vertebral artery dissection and occlusion, with distal right vertebral artery occlusion, and basilar occlusion. Status  post catheter directed revascularization and stenting. 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 EXAM: MRI HEAD WITHOUT CONTRAST MRA HEAD WITHOUT CONTRAST TECHNIQUE: Multiplanar, multiecho pulse sequences of the brain and surrounding structures were obtained without intravenous contrast. Angiographic images of  the head were obtained using MRA technique without contrast. COMPARISON:  Comparison made with prior CTA and MRI from 2018/05/10. FINDINGS: MRI HEAD FINDINGS Brain: Examination mildly degraded by motion artifact. There has been interval evolution of multifocal ischemic infarcts involving the bilateral cerebellar hemispheres, which are now progressed and increased in size as compared to previous MRI. Degree of infarction has most notably increased within the right cerebellum as compared to previous, predominantly involving the right superior cerebral artery territory. Confluent infarction involving the pons and right greater than left medulla have also increased. Few small new superimposed foci of susceptibility artifact involving the left greater than right cerebellum and brainstem consistent with associated petechial hemorrhage (series 18, images 12, 16, 20). Fourth ventricle is partially effaced as compared to previous exam but remains patent at this time. Stable ventricular size without hydrocephalus. Underlying chronic infarcts involving the cerebellum and left splenium again noted. Remainder the brain lesion or midline shift. No extra-axial fluid collection. Vascular: Abnormal flow voids within the right greater than left vertebral arteries, which could be related to slow flow and/or occlusion. Additional abnormal flow void within the basilar artery. Anterior intracranial flow voids maintained. Skull and upper cervical spine: Craniocervical junction normal. Upper cervical spine within normal limits. Bone marrow signal intensity normal. No scalp soft tissue abnormality. Sinuses/Orbits: Globes orbital soft tissues within normal limits. Scattered mucosal thickening within the ethmoidal air cells and maxillary sinuses. Fluid seen layering within the nasopharynx. Patient is intubated. No significant mastoid effusion. Inner ear structures normal. Other: None. MRA HEAD FINDINGS ANTERIOR CIRCULATION: Examination mildly  degraded by motion artifact. Distal cervical segments of the internal carotid arteries are patent with antegrade flow. Petrous, cavernous, and supraclinoid segments patent without hemodynamically significant stenosis. A1 segments widely patent. Normal anterior communicating artery. Anterior cerebral arteries patent to their distal aspects without stenosis. M1 segments widely patent. Normal MCA bifurcations. Distal MCA branches well perfused and symmetric. POSTERIOR CIRCULATION: No significant flow related signal seen within the right vertebral artery. No flow related signal seen within the left vertebral artery as it courses into the skull base. Minimal flow related signal within the left V4 segment near the expected takeoff of the left PICA, which could be due to collateralization (series 9, image 63). No flow seen within the left V4 segment distally. No flow related signal seen within the proximal and mid basilar artery. Right anterior inferior cerebral artery does appear to be patent proximally. Left anterior inferior cerebral artery not visualized. Flow related signal is seen within the distal basilar artery/basilar tip due to collateralization. Superior cerebral arteries are perfused bilaterally. Both posterior cerebral arteries supplied via the basilar as well as robust bilateral posterior communicating arteries. Right PCA is widely patent to its distal aspect. Attenuation of the distal left PCA as compared to the right, consistent with recently identified subacute to chronic left occipital and splenial infarcts. IMPRESSION: MRI HEAD IMPRESSION: 1. Interval evolution of multifocal acute ischemic infarcts involving the bilateral cerebellar hemispheres as well as the pons and brainstem, increased in size in severity as compared to previous MRI. Developing regional mass effect with mild partial effacement of the fourth ventricle as compared to previous exam. No hydrocephalus at this time. 2. Interval development  of small volume scattered petechial  hemorrhage within both cerebral hemispheres as well as the pons. 3. Abnormal flow voids throughout the vertebrobasilar system, better evaluated on concomitant MRA as a below. MRA HEAD IMPRESSION: 1. Absent flow related signal within both vertebral arteries and basilar artery, concerning for occlusion overall, this has worsened from prior CTA, with no appreciable flow seen within the right V4 segment on current exam (preserved on prior study). Distal reconstitution of the distal basilar artery via collateralization via the anterior circulation. Superior cerebellar and posterior cerebral arteries are perfused. 2. Attenuation of the distal left PCA, likely embolic, in consistent with previously identified subacute to chronic left splenial/occipital lobe infarcts. 3. Widely patent anterior circulation. Electronically Signed   By: Rise Mu M.D.   On: 05/08/2018 14:51   Cerebral angiogram 05/02/2018 S/P Rt vert arteriogram followed by complete revascularization of  Entire basilar  Artery with x 2 passes with 5mm x 33mm embotrap retrieverdevice and x 1 pass with 4mm x 40 mm solitaire FR retriever device achieving a TICI 3 revascularization and placement od enterprise stent atsite of flow limiting stenosis in the prox bailar artery with a TICI 3 revascularization  2D Echocardiogram   1. The left ventricle has normal systolic function with an ejection fraction of 60-65%. The cavity size was normal. Left ventricular diastolic parameters were normal.  2. The right ventricle has normal systolic function. The cavity was normal. There is no increase in right ventricular wall thickness.  3. Agitated saline exam indeterminate due to image quality, however no large right to left shunt seen.  4. No intracardiac mass or thrombi identified. No intracardiac source of emboli identified.   PHYSICAL EXAM:: Young Caucasian lady was intubated and not sedated . Afebrile. Head is  nontraumatic. Neck is supple without bruit.    Cardiac exam no murmur or gallop. Lungs are clear to auscultation. Distal pulses are well felt. Neurological Exam :  Patient is  Awake with eyes open. She will follow commands only with vertical eye movements and opening and closing her eyes. 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 She is unable to protrude her tongue. She has very weak cough and gag. She has quadriplegia and is unable to move her hands and feet voluntarily. She is does withdrawal semi-purposefully briskly in the lower extremities and minimally in both upper extremities. Plantars downgoing. Gait not tested.   ASSESSMENT/PLAN Erika Hernandez is a 28 y.o. female with no significant PMH presenting to [facility] with acute onset slurred speech followed by diplopia and L sided numbness.     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 However presence of old cerebellar infarcts raises possibility of occlusive posterior circulation disease with acute on chronic occlusion. S/p IR w/ TICI3 revascularization BA w/ stent placement. 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 Post BA stent w/ initial improvement but then neuro worsening. Intubated. MRI shows increased stroke. 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   CT head patchy B cerebellar L>R hypodensity  MRI head multifocial acute, subacute and chornic posterior circulation infarcts. Attenuated L VA and BA w/ slow flow, occlusion  MRI CS L VA slow flow vs occlusion, o/w neg  CTA head occluded L and distal R VA and VBJ. Occluded BA w/  reconstitution basilar tip and complete COW.  CTA neck occluded L VA. Patent R VA. Normal ICAs  Cerebral angio TICI3 revascularization BA  w/ stent placement following admission  Post IR CT no ICH or mass effect. No hydrocephalus. Ventricles symmetric.   CT head unchanged B cerebellar infarcts of mixed ages  Repeat MRI interval evolution of a multifocal posterior cerebral circulation infarcts involving bilateral cerebellar hemispheres as well as pons which appear to have increased compared with previous MRI from day prior  Repeat MRA head shows normal flow in both distal vertebral arteries as well as basilar arteries suggesting likely occlusion of her basilar stent.  2D Echo bubble EF 60-65%. No source of embolus. Neg bubble  LDL 179  HgbA1c 5.1  SCDs for VTE prophylaxis. Add Heparin 5000 units sq tid   No antithrombotic prior to admission, now on aspirin 81 mg daily and Brilinta (ticagrelor) 90 mg bid.continue Brilinta x 30 days along with aspirin at discharge then change to plavix and aspirin.  Therapy recommendations:  pending   Disposition:  pending .   Brother reaching out to the [employer] from [employer]  Has travelled today to  see her. The RN will notify us once this occurs and letter needed can be provided.  Respiratory Failure  Compromised airway following IR and BA stent   Intubated on 4N  Sedated   Will need early trach if family wants aggressive care - mother awaiting brother's arrival  CCM onboard  Blood Pressure  Treated post IR w/ cleviprex drip, now off  BP stable 130-140s  No hx HTN  Home meds: none  Dysphagia  Secondary to stroke  NPO  On  TF  Will need early PEG if family wants aggressive care - mother awaiting brother's arrival  Hyperlipidemia  Home meds:  No statin  LDL 179, goal < 70  Added statin - lipitor 80  Continue statin at discharge  Other Stroke Risk Factors  ETOH use, advised to drink no more than 1 drink(s) a day  Overweight  Body mass index is 29.16 kg/m., recommend weight loss, diet and exercise as appropriate   Other Active  Problems  Anxiety/depression, OCD on zoloft and rexulti  CXR neg  Hospital day # 3  I have personally obtained history,examined this patient, reviewed notes, independently viewed imaging studies, participated in medical decision making and plan of care.ROS completed by me personally and pertinent positives fully documented  I have made any additions or clarifications directly to the above note. . The patient's neurological condition unfortunately remains unchanged with locked-in state. She is likely going to need prolonged ventilatory support and feeding tube and 24-hour nursing care. I spoke to the patient's mother over the phone and she requested to meet in person with her son.Plan family meeting with mother and brother later this afternoon to make decisions about goals of care and early trach and PEG. Discussed with Dr. Delton Coombes critical care medicine This patient is critically ill and at significant risk of neurological worsening, death and care requires constant monitoring of vital signs, hemodynamics,respiratory and cardiac monitoring, extensive review of multiple databases, frequent neurological assessment, discussion with family, other specialists and medical decision making of high complexity.I have made any additions or clarifications directly to the above note.This critical care time does not reflect procedure time, or teaching time or supervisory time of PA/NP/Med Resident etc but could involve care discussion time.  I spent 30 minutes of neurocritical care time  in the care of  this patient.        Delia Heady, MD Medical Director [facility] Pager: [phone] 05/10/2018 10:29 AM   To contact Stroke Continuity provider, please refer to [email or web address]. After hours, contact General Neurology

## 2018-05-10 NOTE — Progress Notes (Signed)
  Consult received, chart reviewed.  Staffed with Amy, RN.  She informs me that patient's mother and brother have met with Dr. Leonie Man today.  Plan is to liberate patient from the vent at some point and per RN family indicated that they wish patient to be DNR (do not see that change in the chart, CODE STATUS is listed as prior).  I did reach out to Mrs. Harville, and left message for return call.  Palliative medicine to stay involved and assist family and staff in any way that we can in terms of moving forward with one-way extubation, comfort care if this is the family's plan  Thank you Romona Curls, NP

## 2018-05-10 NOTE — Progress Notes (Signed)
Patient ID: Erika Hernandez, female   DOB: 1990/10/16, 28 y.o.   MRN: [id number]   IR Round note via phone-- new regulations  Basilar artery revascularization /stent in IR 3/31  No change in status  Mother and brother on way to Hospital for GOC meeting with Dr Pearlean Brownie Plan will be in chart after meeting

## 2018-05-11 DIAGNOSIS — F411 Generalized anxiety disorder: Secondary | ICD-10-CM

## 2018-05-11 DIAGNOSIS — K117 Disturbances of salivary secretion: Secondary | ICD-10-CM

## 2018-05-11 DIAGNOSIS — G835 Locked-in state: Secondary | ICD-10-CM

## 2018-05-11 DIAGNOSIS — R0602 Shortness of breath: Secondary | ICD-10-CM

## 2018-05-11 DIAGNOSIS — Z7189 Other specified counseling: Secondary | ICD-10-CM

## 2018-05-11 LAB — BASIC METABOLIC PANEL
Anion gap: 8 (ref 5–15)
BUN: 15 mg/dL (ref 6–20)
CO2: 27 mmol/L (ref 22–32)
Calcium: 8.7 mg/dL — ABNORMAL LOW (ref 8.9–10.3)
Chloride: 106 mmol/L (ref 98–111)
Creatinine, Ser: 0.59 mg/dL (ref 0.44–1.00)
GFR calc Af Amer: >60 mL/min (ref >60)
GFR calc non Af Amer: >60 mL/min (ref >60)
Glucose, Bld: 131 mg/dL — ABNORMAL HIGH (ref 70–99)
Potassium: 3.7 mmol/L (ref 3.5–5.1)
Sodium: 141 mmol/L (ref 135–145)

## 2018-05-11 LAB — CBC
HCT: 41.4 % (ref 36.0–46.0)
Hemoglobin: 13 g/dL (ref 12.0–15.0)
MCH: 29.7 pg (ref 26.0–34.0)
MCHC: 31.4 g/dL (ref 30.0–36.0)
MCV: 94.5 fL (ref 80.0–100.0)
Platelets: 177 10*3/uL (ref 150–400)
RBC: 4.38 MIL/uL (ref 3.87–5.11)
RDW: 12.1 % (ref 11.5–15.5)
WBC: 10.9 10*3/uL — ABNORMAL HIGH (ref 4.0–10.5)
nRBC: 0 % (ref 0.0–0.2)

## 2018-05-11 LAB — GLUCOSE, CAPILLARY
Glucose-Capillary: 116 mg/dL — ABNORMAL HIGH (ref 70–99)
Glucose-Capillary: 118 mg/dL — ABNORMAL HIGH (ref 70–99)

## 2018-05-11 MED ORDER — GLYCOPYRROLATE 0.2 MG/ML IJ SOLN
0.4000 mg | Freq: Four times a day (QID) | INTRAMUSCULAR | Status: DC
  Administered 2018-05-11: 0.4 mg via INTRAVENOUS
  Filled 2018-05-11: qty 2

## 2018-05-11 MED ORDER — MORPHINE 100MG IN NS 100ML (1MG/ML) PREMIX INFUSION
1.0000 mg/h | INTRAVENOUS | Status: DC
  Administered 2018-05-11: 1 mg/h via INTRAVENOUS
  Filled 2018-05-11: qty 100

## 2018-05-11 MED ORDER — MIDAZOLAM 50MG/50ML (1MG/ML) PREMIX INFUSION
1.0000 mg/h | INTRAVENOUS | Status: DC
  Administered 2018-05-11: 1 mg/h via INTRAVENOUS
  Filled 2018-05-11: qty 50

## 2018-05-11 MED ORDER — MIDAZOLAM BOLUS VIA INFUSION
2.0000 mg | INTRAVENOUS | Status: DC | PRN
  Filled 2018-05-11: qty 4

## 2018-05-11 MED ORDER — MORPHINE BOLUS VIA INFUSION
1.0000 mg | INTRAVENOUS | Status: DC | PRN
  Filled 2018-05-11: qty 10

## 2018-05-13 ENCOUNTER — Encounter (HOSPITAL_COMMUNITY): Payer: Self-pay | Admitting: Interventional Radiology

## 2018-06-07 NOTE — Progress Notes (Signed)
   05/31/2018 1500  Clinical Encounter Type  Visited With Patient and family together  Visit Type Initial;Spiritual support;Death  Referral From Nurse  Consult/Referral To Chaplain  Spiritual Encounters  Spiritual Needs Emotional;Prayer;Literature  Stress Factors  Patient Stress Factors None identified  Family Stress Factors Loss;Major life changes   Responded to page for EOL spiritual care consult for family support. PT expired and Family grieving. I offered spiritual and emotional support to Mother and brother of PT. Mother stated their pastor has been praying for PT and family. Nursing staff showed great compassion and professionalism. I gave words of comfort, ministry of presence, empathic listening, sacred test reading and prayer. Family was very thankful for the Chaplain presence.  Chaplain Orest Dikes [phone]

## 2018-06-07 NOTE — Procedures (Signed)
Extubation Procedure Note  Patient Details:   Name: Erika Hernandez DOB: 1990/07/15 MRN: [id number]   Airway Documentation:    Vent end date: 05/23/2018 Vent end time: 1455   Evaluation  O2 sats: stable throughout Complications: No apparent complications Patient did tolerate procedure well. Bilateral Breath Sounds: Diminished, Rhonchi   Yes   Pt extubated per MD order.  PT placed on 2l [place]. Pt is a DNR/DNI with comfort care only.  RN and family at bedside. RT will continue to monitor.    Ronny Flurry 05/30/2018, 3:03 PM

## 2018-06-07 NOTE — Death Summary Note (Signed)
Patient ID: Erika Hernandez   MRN: [id number]      DOB: [birth year]  Date of Admission: 05/05/2018 Date of Discharge: May 22, 2018  Attending Physician:  Garvin Fila, MD, Stroke MD Consultant(s):   Treatment Team:  Stroke, Md, MD Palliative Care ; Critical Care ; Interventional Radiology Patient's PCP:  Patient, No Pcp Per  DISCHARGE DIAGNOSIS: Deceased - One way extubation. 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  Interventional Radiology - basilar artery stent  Active Problems:   Stroke (cerebrum) (HCC)   Goals of care, counseling/discussion   Shortness of breath   Increased oropharyngeal secretions   Anxiety state   Past Medical History:  Diagnosis Date  . Asthma   . GAD (generalized anxiety disorder)   . Kidney stone   . Major depressive disorder   . Mixed obsessional thoughts and acts   . OCD (obsessive compulsive disorder)    Past Surgical History:  Procedure Laterality Date  . ARTHROSCOPIC REPAIR ACL    . RADIOLOGY WITH ANESTHESIA N/A 04/28/2018   Procedure: IR WITH ANESTHESIA;  Surgeon: Luanne Bras, MD;  Location: [facility];  Service: Radiology;  Laterality: N/A;      HOME MEDICATIONS PRIOR TO ADMISSION Medications Prior to Admission  Medication Sig Dispense Refill  . Brexpiprazole (REXULTI) 2 MG TABS Take 2 mg by mouth every morning. 30 tablet 5  . sertraline (ZOLOFT) 100 MG tablet Take 3 tablets (300 mg total) by mouth daily. 270 tablet 1     HOSPITAL MEDICATIONS . glycopyrrolate  0.4 mg Intravenous Q6H  . mouth rinse  15 mL Mouth Rinse 10 times per day    LABORATORY STUDIES CBC    Component Value Date/Time   WBC 10.9 (H) 05/22/18 0252   RBC 4.38 May 22, 2018 0252   HGB 13.0 05-22-18 0252   HGB 14.7 02/25/2018 1047   HCT 41.4 2018/05/22 0252   HCT 43.2 02/25/2018 1047   PLT 177 2018/05/22 0252   PLT 219 02/25/2018 1047   MCV 94.5 05-22-18  0252   MCV 89 02/25/2018 1047   MCH 29.7 22-May-2018 0252   MCHC 31.4 05-22-2018 0252   RDW 12.1 2018-05-22 0252   RDW 12.1 02/25/2018 1047   LYMPHSABS 1.0 05/08/2018 0502   LYMPHSABS 2.0 02/25/2018 1047   MONOABS 1.0 05/08/2018 0502   EOSABS 0.0 05/08/2018 0502   EOSABS 0.3 02/25/2018 1047   BASOSABS 0.0 05/08/2018 0502   BASOSABS 0.0 02/25/2018 1047   CMP    Component Value Date/Time   NA 141 2018/05/22 0252   NA 140 02/25/2018 1047   K 3.7 05-22-18 0252   CL 106 05/22/2018 0252   CO2 27 05-22-2018 0252   GLUCOSE 131 (H) 05-22-18 0252   BUN 15 May 22, 2018 0252   BUN 10 02/25/2018 1047   CREATININE 0.59 May 22, 2018 0252   CALCIUM 8.7 (L) 05-22-2018 0252   PROT 6.6 04/17/2018 2052   PROT 6.0 02/25/2018 1047   ALBUMIN 3.2 (L) 04/09/2018 2052   ALBUMIN 3.5 (L) 02/25/2018 1047   AST 22 04/27/2018 2052   ALT 16 04/12/2018 2052   ALKPHOS 80 04/26/2018 2052   BILITOT 0.5 04/10/2018 2052   BILITOT 0.3 02/25/2018 1047   GFRNONAA >60 2018-05-22 0252   GFRAA >60 05/22/18 0252   COAGS Lab Results  Component Value Date   INR 1.1 04/07/2018   Lipid Panel    Component Value Date/Time  CHOL 273 (H) 05/01/2018 0449   CHOL 227 (H) 02/25/2018 1047   TRIG 104 04/14/2018 2200   HDL 76 04/09/2018 0449   HDL 64 02/25/2018 1047   CHOLHDL 3.6 04/10/2018 0449   VLDL 18 04/15/2018 0449   LDLCALC 179 (H) 04/22/2018 0449   LDLCALC 147 (H) 02/25/2018 1047   HgbA1C  Lab Results  Component Value Date   HGBA1C 5.1 04/29/2018   Urinalysis    Component Value Date/Time   BILIRUBINUR negative 05/01/2016 0924   KETONESUR negative 05/01/2016 0924   PROTEINUR negative 05/01/2016 0924   UROBILINOGEN 0.2 05/01/2016 0924   NITRITE Negative 05/01/2016 0924   LEUKOCYTESUR Negative 05/01/2016 0924   Urine Drug Screen     Component Value Date/Time   LABOPIA NONE DETECTED 04/20/2018 1800   COCAINSCRNUR NONE DETECTED 04/12/2018 1800   LABBENZ NONE DETECTED 04/15/2018 1800   AMPHETMU  NONE DETECTED 04/21/2018 1800   THCU NONE DETECTED 04/16/2018 1800   LABBARB NONE DETECTED 04/07/2018 1800    Alcohol Level No results found for: [facility]   SIGNIFICANT DIAGNOSTIC STUDIES  Ct Angio Head W Or Wo Contrast  Result Date: 05/01/2018 CLINICAL DATA:  LEFT-sided numbness this and neck pain. Follow-up infarcts. EXAM: CT ANGIOGRAPHY HEAD AND NECK TECHNIQUE: Multidetector CT imaging of the head and neck was performed using the standard protocol during bolus administration of intravenous contrast. Multiplanar CT image reconstructions and MIPs were obtained to evaluate the vascular anatomy. Carotid stenosis measurements (when applicable) are obtained utilizing NASCET criteria, using the distal internal carotid diameter as the denominator. CONTRAST:  75 cc ISOVUE-370 IOPAMIDOL (ISOVUE-370) INJECTION 76% COMPARISON:  CT HEAD May 06, 2018 and MRI head May 07, 2018. FINDINGS: CTA NECK FINDINGS: AORTIC ARCH: Normal appearance of the thoracic arch, normal branch pattern. The origins of the innominate, left Common carotid artery and subclavian artery are patent. RIGHT CAROTID SYSTEM: Common carotid artery is patent. Normal appearance of the carotid bifurcation without hemodynamically significant stenosis by NASCET criteria. Normal appearance of the internal carotid artery. LEFT CAROTID SYSTEM: Common carotid artery is patent. Normal appearance of the carotid bifurcation without hemodynamically significant stenosis by NASCET criteria. Normal appearance of the internal carotid artery. VERTEBRAL ARTERIES:LEFT vertebral artery occluded from origin without reconstitution in the neck. Normal RIGHT vertebral artery. SKELETON: No acute osseous process though bone windows have not been submitted. OTHER NECK: Soft tissues of the neck are nonacute though, not tailored for evaluation. UPPER CHEST: Mild heterogeneous lung attenuation seen with small airway disease. CTA HEAD FINDINGS: ANTERIOR CIRCULATION: Patent cervical  internal carotid arteries, petrous, cavernous and supra clinoid internal carotid arteries. Patent anterior communicating artery. Patent anterior and middle cerebral arteries. No large vessel occlusion, flow-limiting stenosis, contrast extravasation or aneurysm. 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 Occluded distal RIGHT vertebral artery. Occluded vertebrobasilar junction and basilar artery with collateral vessel within interpeduncular notch. Reconstitution of basilar artery at tip. Robust posterior communicating arteries present. No large vessel occlusion, flow-limiting stenosis, contrast extravasation or aneurysm. VENOUS SINUSES: Major dural venous sinuses are patent though not tailored for evaluation on this angiographic examination. ANATOMIC VARIANTS: None. DELAYED PHASE: Similar nodular enhancement LEFT > RIGHT occipital lobes corresponding to subacute infarcts. MIP images reviewed. IMPRESSION: CTA NECK: 1. 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 Patent RIGHT vertebral artery. 2. Normal bilateral ICA's. CTA HEAD: 1. 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 Acute findings discussed with and reconfirmed  by Dr.Kirkpatrick, Neurology on 05/03/2018 at 4:01 am. Electronically Signed   By: Elon Alas M.D.   On: 04/20/2018 04:03   Ct Head Wo Contrast  Result Date: 04/21/2018 CLINICAL DATA:  Stroke follow-up EXAM: CT HEAD WITHOUT CONTRAST TECHNIQUE: Contiguous axial images were obtained from the base of the skull through the vertex without intravenous contrast. COMPARISON:  Head CT 04/24/2018 FINDINGS: Brain: Unchanged hypoattenuation in the cerebellar hemispheres compatible with infarcts of mixed ages as demonstrated on recent MRI. No intracranial hemorrhage. No midline shift or other mass effect. The  supratentorial brain is unremarkable. Vascular: No abnormal hyperdensity of the major intracranial arteries or dural venous sinuses. No intracranial atherosclerosis. Skull: The visualized skull base, calvarium and extracranial soft tissues are normal. Sinuses/Orbits: No fluid levels or advanced mucosal thickening of the visualized paranasal sinuses. No mastoid or middle ear effusion. The orbits are normal. 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 No hemorrhage or mass effect. Electronically Signed   By: Ulyses Jarred M.D.   On: 04/27/2018 20:42   Ct Head Wo Contrast  Result Date: 04/15/2018 CLINICAL DATA:  28 year old female with left leg numbness and weakness. EXAM: CT HEAD WITHOUT CONTRAST TECHNIQUE: Contiguous axial images were obtained from the base of the skull through the vertex without intravenous contrast. COMPARISON:  None. FINDINGS: Brain: The ventricles and sulci appropriate size for patient's age. Patchy areas of hypodensity in the cerebellar hemispheres bilaterally, left greater right, most consistent with areas of infarct of indeterminate age. Acute infarct is not excluded. Clinical correlation is recommended. If there is clinical concern for acute infarct further evaluation with MRI is recommended. A somewhat linear area hypodensity in the right cerebellar hemisphere likely represents an old infarct. There is no acute intracranial hemorrhage. No mass effect or midline shift. No extra-axial fluid collection. Vascular: No hyperdense vessel or unexpected calcification. Skull: Normal. Negative for fracture or focal lesion. Sinuses/Orbits: No acute finding. Other: None IMPRESSION: 1. No acute intracranial hemorrhage. 2. Patchy areas of hypodensity in the cerebellar hemispheres bilaterally, left greater right, most consistent with areas of infarct of indeterminate age. Acute infarct is not excluded. Clinical correlation is recommended. If there is clinical concern  for acute infarct further evaluation with MRI recommended. These results were called by telephone at the time of interpretation on 05/01/2018 at 9:25 pm to Dr. Julianne Rice , who verbally acknowledged these results. Electronically Signed   By: Anner Crete M.D.   On: 04/15/2018 21:31   Ct Angio Neck W And/or Wo Contrast  Result Date: 04/15/2018 CLINICAL DATA:  LEFT-sided numbness this and neck pain. Follow-up infarcts. EXAM: CT ANGIOGRAPHY HEAD AND NECK TECHNIQUE: Multidetector CT imaging of the head and neck was performed using the standard protocol during bolus administration of intravenous contrast. Multiplanar CT image reconstructions and MIPs were obtained to evaluate the vascular anatomy. Carotid stenosis measurements (when applicable) are obtained utilizing NASCET criteria, using the distal internal carotid diameter as the denominator. CONTRAST:  75 cc ISOVUE-370 IOPAMIDOL (ISOVUE-370) INJECTION 76% COMPARISON:  CT HEAD May 06, 2018 and MRI head May 07, 2018. FINDINGS: CTA NECK FINDINGS: AORTIC ARCH: Normal appearance of the thoracic arch, normal branch pattern. The origins of the innominate, left Common carotid artery and subclavian artery are patent. RIGHT CAROTID SYSTEM: Common carotid artery is patent. Normal appearance of the carotid bifurcation without hemodynamically significant stenosis by NASCET criteria. Normal appearance of the internal carotid artery. LEFT CAROTID SYSTEM: Common carotid artery is patent. Normal appearance of the carotid  bifurcation without hemodynamically significant stenosis by NASCET criteria. Normal appearance of the internal carotid artery. VERTEBRAL ARTERIES:LEFT vertebral artery occluded from origin without reconstitution in the neck. Normal RIGHT vertebral artery. SKELETON: No acute osseous process though bone windows have not been submitted. OTHER NECK: Soft tissues of the neck are nonacute though, not tailored for evaluation. UPPER CHEST: Mild heterogeneous  lung attenuation seen with small airway disease. CTA HEAD FINDINGS: ANTERIOR CIRCULATION: Patent cervical internal carotid arteries, petrous, cavernous and supra clinoid internal carotid arteries. Patent anterior communicating artery. Patent anterior and middle cerebral arteries. No large vessel occlusion, flow-limiting stenosis, contrast extravasation or aneurysm. 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 Occluded distal RIGHT vertebral artery. Occluded vertebrobasilar junction and basilar artery with collateral vessel within interpeduncular notch. Reconstitution of basilar artery at tip. Robust posterior communicating arteries present. No large vessel occlusion, flow-limiting stenosis, contrast extravasation or aneurysm. VENOUS SINUSES: Major dural venous sinuses are patent though not tailored for evaluation on this angiographic examination. ANATOMIC VARIANTS: None. DELAYED PHASE: Similar nodular enhancement LEFT > RIGHT occipital lobes corresponding to subacute infarcts. MIP images reviewed. IMPRESSION: CTA NECK: 1. 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 Patent RIGHT vertebral artery. 2. Normal bilateral ICA's. CTA HEAD: 1. 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 Acute findings discussed with and reconfirmed by Dr.Kirkpatrick, Neurology on 05/04/2018 at 4:01 am. Electronically Signed   By: Elon Alas M.D.   On: 04/22/2018 04:03   Mr Jodene Nam Head Wo Contrast  Result Date: 05/08/2018 CLINICAL DATA:  Initial evaluation for acute stroke. Patient with history of left vertebral artery dissection and occlusion, with distal right vertebral artery occlusion, and basilar occlusion. Status post catheter directed revascularization and stenting. 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 EXAM: MRI HEAD WITHOUT CONTRAST MRA  HEAD WITHOUT CONTRAST TECHNIQUE: Multiplanar, multiecho pulse sequences of the brain and surrounding structures were obtained without intravenous contrast. Angiographic images of the head were obtained using MRA technique without contrast. COMPARISON:  Comparison made with prior CTA and MRI from 04/24/2018. FINDINGS: MRI HEAD FINDINGS Brain: Examination mildly degraded by motion artifact. There has been interval evolution of multifocal ischemic infarcts involving the bilateral cerebellar hemispheres, which are now progressed and increased in size as compared to previous MRI. Degree of infarction has most notably increased within the right cerebellum as compared to previous, predominantly involving the right superior cerebral artery territory. Confluent infarction involving the pons and right greater than left medulla have also increased. Few small new superimposed foci of susceptibility artifact involving the left greater than right cerebellum and brainstem consistent with associated petechial hemorrhage (series 18, images 12, 16, 20). Fourth ventricle is partially effaced as compared to previous exam but remains patent at this time. Stable ventricular size without hydrocephalus. Underlying chronic infarcts involving the cerebellum and left splenium again noted. Remainder the brain lesion or midline shift. No extra-axial fluid collection. Vascular: Abnormal flow voids within the right greater than left vertebral arteries, which could be related to slow flow and/or occlusion. Additional abnormal flow void within the basilar artery. Anterior intracranial flow voids maintained. Skull and upper cervical spine: Craniocervical junction normal. Upper cervical spine within normal limits. Bone marrow signal intensity normal. No scalp soft tissue abnormality. Sinuses/Orbits: Globes orbital soft tissues within normal limits. Scattered mucosal thickening within the ethmoidal air cells and maxillary sinuses. Fluid seen layering  within the nasopharynx. Patient is intubated. No significant mastoid effusion.  Inner ear structures normal. Other: None. MRA HEAD FINDINGS ANTERIOR CIRCULATION: Examination mildly degraded by motion artifact. Distal cervical segments of the internal carotid arteries are patent with antegrade flow. Petrous, cavernous, and supraclinoid segments patent without hemodynamically significant stenosis. A1 segments widely patent. Normal anterior communicating artery. Anterior cerebral arteries patent to their distal aspects without stenosis. M1 segments widely patent. Normal MCA bifurcations. Distal MCA branches well perfused and symmetric. POSTERIOR CIRCULATION: No significant flow related signal seen within the right vertebral artery. No flow related signal seen within the left vertebral artery as it courses into the skull base. Minimal flow related signal within the left V4 segment near the expected takeoff of the left PICA, which could be due to collateralization (series 9, image 63). No flow seen within the left V4 segment distally. No flow related signal seen within the proximal and mid basilar artery. Right anterior inferior cerebral artery does appear to be patent proximally. Left anterior inferior cerebral artery not visualized. Flow related signal is seen within the distal basilar artery/basilar tip due to collateralization. Superior cerebral arteries are perfused bilaterally. Both posterior cerebral arteries supplied via the basilar as well as robust bilateral posterior communicating arteries. Right PCA is widely patent to its distal aspect. Attenuation of the distal left PCA as compared to the right, consistent with recently identified subacute to chronic left occipital and splenial infarcts. IMPRESSION: MRI HEAD IMPRESSION: 1. Interval evolution of multifocal acute ischemic infarcts involving the bilateral cerebellar hemispheres as well as the pons and brainstem, increased in size in severity as compared to  previous MRI. Developing regional mass effect with mild partial effacement of the fourth ventricle as compared to previous exam. No hydrocephalus at this time. 2. Interval development of small volume scattered petechial hemorrhage within both cerebral hemispheres as well as the pons. 3. Abnormal flow voids throughout the vertebrobasilar system, better evaluated on concomitant MRA as a below. MRA HEAD IMPRESSION: 1. Absent flow related signal within both vertebral arteries and basilar artery, concerning for occlusion overall, this has worsened from prior CTA, with no appreciable flow seen within the right V4 segment on current exam (preserved on prior study). Distal reconstitution of the distal basilar artery via collateralization via the anterior circulation. Superior cerebellar and posterior cerebral arteries are perfused. 2. Attenuation of the distal left PCA, likely embolic, in consistent with previously identified subacute to chronic left splenial/occipital lobe infarcts. 3. Widely patent anterior circulation. Electronically Signed   By: Jeannine Boga M.D.   On: 05/08/2018 14:51   Mr Brain Wo Contrast  Result Date: 05/08/2018 CLINICAL DATA:  Initial evaluation for acute stroke. Patient with history of left vertebral artery dissection and occlusion, with distal right vertebral artery occlusion, and basilar occlusion. Status post catheter directed revascularization and stenting. 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 EXAM: MRI HEAD WITHOUT CONTRAST MRA HEAD WITHOUT CONTRAST TECHNIQUE: Multiplanar, multiecho pulse sequences of the brain and surrounding structures were obtained without intravenous contrast. Angiographic images of the head were obtained using MRA technique without contrast. COMPARISON:  Comparison made with prior CTA and MRI from 04/07/2018. FINDINGS: MRI HEAD FINDINGS Brain: Examination mildly degraded by motion artifact. There has been interval evolution of multifocal ischemic infarcts  involving the bilateral cerebellar hemispheres, which are now progressed and increased in size as compared to previous MRI. Degree of infarction has most notably increased within the right cerebellum as compared to previous, predominantly involving the right superior cerebral artery territory. Confluent infarction involving the pons and right greater than  left medulla have also increased. Few small new superimposed foci of susceptibility artifact involving the left greater than right cerebellum and brainstem consistent with associated petechial hemorrhage (series 18, images 12, 16, 20). Fourth ventricle is partially effaced as compared to previous exam but remains patent at this time. Stable ventricular size without hydrocephalus. Underlying chronic infarcts involving the cerebellum and left splenium again noted. Remainder the brain lesion or midline shift. No extra-axial fluid collection. Vascular: Abnormal flow voids within the right greater than left vertebral arteries, which could be related to slow flow and/or occlusion. Additional abnormal flow void within the basilar artery. Anterior intracranial flow voids maintained. Skull and upper cervical spine: Craniocervical junction normal. Upper cervical spine within normal limits. Bone marrow signal intensity normal. No scalp soft tissue abnormality. Sinuses/Orbits: Globes orbital soft tissues within normal limits. Scattered mucosal thickening within the ethmoidal air cells and maxillary sinuses. Fluid seen layering within the nasopharynx. Patient is intubated. No significant mastoid effusion. Inner ear structures normal. Other: None. MRA HEAD FINDINGS ANTERIOR CIRCULATION: Examination mildly degraded by motion artifact. Distal cervical segments of the internal carotid arteries are patent with antegrade flow. Petrous, cavernous, and supraclinoid segments patent without hemodynamically significant stenosis. A1 segments widely patent. Normal anterior communicating  artery. Anterior cerebral arteries patent to their distal aspects without stenosis. M1 segments widely patent. Normal MCA bifurcations. Distal MCA branches well perfused and symmetric. POSTERIOR CIRCULATION: No significant flow related signal seen within the right vertebral artery. No flow related signal seen within the left vertebral artery as it courses into the skull base. Minimal flow related signal within the left V4 segment near the expected takeoff of the left PICA, which could be due to collateralization (series 9, image 63). No flow seen within the left V4 segment distally. No flow related signal seen within the proximal and mid basilar artery. Right anterior inferior cerebral artery does appear to be patent proximally. Left anterior inferior cerebral artery not visualized. Flow related signal is seen within the distal basilar artery/basilar tip due to collateralization. Superior cerebral arteries are perfused bilaterally. Both posterior cerebral arteries supplied via the basilar as well as robust bilateral posterior communicating arteries. Right PCA is widely patent to its distal aspect. Attenuation of the distal left PCA as compared to the right, consistent with recently identified subacute to chronic left occipital and splenial infarcts. IMPRESSION: MRI HEAD IMPRESSION: 1. Interval evolution of multifocal acute ischemic infarcts involving the bilateral cerebellar hemispheres as well as the pons and brainstem, increased in size in severity as compared to previous MRI. Developing regional mass effect with mild partial effacement of the fourth ventricle as compared to previous exam. No hydrocephalus at this time. 2. Interval development of small volume scattered petechial hemorrhage within both cerebral hemispheres as well as the pons. 3. Abnormal flow voids throughout the vertebrobasilar system, better evaluated on concomitant MRA as a below. MRA HEAD IMPRESSION: 1. Absent flow related signal within both  vertebral arteries and basilar artery, concerning for occlusion overall, this has worsened from prior CTA, with no appreciable flow seen within the right V4 segment on current exam (preserved on prior study). Distal reconstitution of the distal basilar artery via collateralization via the anterior circulation. Superior cerebellar and posterior cerebral arteries are perfused. 2. Attenuation of the distal left PCA, likely embolic, in consistent with previously identified subacute to chronic left splenial/occipital lobe infarcts. 3. Widely patent anterior circulation. Electronically Signed   By: Jeannine Boga M.D.   On: 05/08/2018 14:51   Mr  Brain W And Wo Contrast  Result Date: 04/30/2018 CLINICAL DATA:  LEFT neck pain for a few days. LEFT arm numbness beginning this afternoon and transient slurred speech. EXAM: MRI HEAD WITHOUT AND WITH CONTRAST MRI CERVICAL SPINE WITHOUT AND WITH CONTRAST TECHNIQUE: Multiplanar, multiecho pulse sequences of the brain and surrounding structures, and cervical spine, to include the craniocervical junction and cervicothoracic junction, were obtained without and with intravenous contrast. CONTRAST:  8 cc Gadavist COMPARISON:  CT HEAD May 06, 2018 FINDINGS: MRI HEAD FINDINGS INTRACRANIAL CONTENTS: Patchy LEFT > RIGHT cerebellar and pontine reduced diffusion with low ADC values and to lesser extent normalized ADC values. Nodular enhancement of LEFT > RIGHT occipital lobes consistent with subacute infarct. Old small RIGHT cerebellar, LEFT splenium of corpus callosum, LEFT periventricular white matter and LEFT occipital infarcts. No midline shift, masses or abnormal extra-axial fluid collections. No parenchymal brain volume loss for age. No hydrocephalus. No abnormal parenchymal or extra-axial enhancement though post gadolinium sequences are moderately motion degraded. VASCULAR: Slightly attenuated LEFT vertebral artery flow void in the neck. Attenuated basilar artery flow void.  SKULL AND UPPER CERVICAL SPINE: No abnormal sellar expansion. No suspicious calvarial bone marrow signal. Craniocervical junction maintained. SINUSES/ORBITS: The mastoid air-cells and included paranasal sinuses are well-aerated.The included ocular globes and orbital contents are non-suspicious. OTHER: None. MRI CERVICAL SPINE FINDINGS ALIGNMENT: Straightened cervical lordosis.  No malalignment. VERTEBRAE/DISCS: Vertebral bodies are intact. Intervertebral disc morphology's and signal are normal. No abnormal or acute bone marrow signal. No abnormal osseous or disc enhancement. CORD:Cervical spinal cord is normal morphology and signal characteristics from the cervicomedullary junction to level of T2-3, the most caudal well visualized level. POSTERIOR FOSSA, VERTEBRAL ARTERIES, PARASPINAL TISSUES: No MR findings of ligamentous injury. Attenuated LEFT vertebral artery flow void. DISC LEVELS: No disc bulge, canal stenosis nor neural foraminal narrowing. IMPRESSION: MRI head: 1. 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 2. Attenuated LEFT vertebral artery and basilar artery seen with slow flow or occlusion. Recommend CTA HEAD and NECK. MRI cervical spine: 1. Slow flow versus occluded LEFT vertebral artery. 2. Otherwise negative MRI cervical spine with and without contrast. Acute findings discussed with and reconfirmed by Dr.KRISTEN WARD on 04/25/2018 at 3:14 am. Electronically Signed   By: Elon Alas M.D.   On: 04/28/2018 03:20   Mr Cervical Spine W Or Wo Contrast  Result Date: 04/11/2018 CLINICAL DATA:  LEFT neck pain for a few days. LEFT arm numbness beginning this afternoon and transient slurred speech. EXAM: MRI HEAD WITHOUT AND WITH CONTRAST MRI CERVICAL SPINE WITHOUT AND WITH CONTRAST TECHNIQUE: Multiplanar, multiecho pulse sequences of the brain and surrounding structures, and cervical spine, to include the craniocervical junction and cervicothoracic junction,  were obtained without and with intravenous contrast. CONTRAST:  8 cc Gadavist COMPARISON:  CT HEAD May 06, 2018 FINDINGS: MRI HEAD FINDINGS INTRACRANIAL CONTENTS: Patchy LEFT > RIGHT cerebellar and pontine reduced diffusion with low ADC values and to lesser extent normalized ADC values. Nodular enhancement of LEFT > RIGHT occipital lobes consistent with subacute infarct. Old small RIGHT cerebellar, LEFT splenium of corpus callosum, LEFT periventricular white matter and LEFT occipital infarcts. No midline shift, masses or abnormal extra-axial fluid collections. No parenchymal brain volume loss for age. No hydrocephalus. No abnormal parenchymal or extra-axial enhancement though post gadolinium sequences are moderately motion degraded. VASCULAR: Slightly attenuated LEFT vertebral artery flow void in the neck. Attenuated basilar artery flow void. SKULL AND UPPER CERVICAL SPINE: No abnormal sellar expansion. No suspicious calvarial bone  marrow signal. Craniocervical junction maintained. SINUSES/ORBITS: The mastoid air-cells and included paranasal sinuses are well-aerated.The included ocular globes and orbital contents are non-suspicious. OTHER: None. MRI CERVICAL SPINE FINDINGS ALIGNMENT: Straightened cervical lordosis.  No malalignment. VERTEBRAE/DISCS: Vertebral bodies are intact. Intervertebral disc morphology's and signal are normal. No abnormal or acute bone marrow signal. No abnormal osseous or disc enhancement. CORD:Cervical spinal cord is normal morphology and signal characteristics from the cervicomedullary junction to level of T2-3, the most caudal well visualized level. POSTERIOR FOSSA, VERTEBRAL ARTERIES, PARASPINAL TISSUES: No MR findings of ligamentous injury. Attenuated LEFT vertebral artery flow void. DISC LEVELS: No disc bulge, canal stenosis nor neural foraminal narrowing. IMPRESSION: MRI head: 1. 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 2. Attenuated LEFT  vertebral artery and basilar artery seen with slow flow or occlusion. Recommend CTA HEAD and NECK. MRI cervical spine: 1. Slow flow versus occluded LEFT vertebral artery. 2. Otherwise negative MRI cervical spine with and without contrast. Acute findings discussed with and reconfirmed by Dr.KRISTEN WARD on 04/16/2018 at 3:14 am. Electronically Signed   By: Elon Alas M.D.   On: 04/27/2018 03:20   Dg Chest Portable 1 View  Result Date: 05/04/2018 CLINICAL DATA:  Intubation EXAM: PORTABLE CHEST 1 VIEW COMPARISON:  Portable exam 1707 hours compared to 04/19/2018 at 0610 hours FINDINGS: Tip of endotracheal tube projects 4.0 cm above carina. Normal heart size, mediastinal contours, and pulmonary vascularity. Lungs clear. No definite infiltrate, pleural effusion or pneumothorax. IMPRESSION: No acute abnormalities. Electronically Signed   By: Lavonia Dana M.D.   On: 05/02/2018 17:44   Dg Chest Port 1 View  Result Date: 04/07/2018 CLINICAL DATA:  Stroke EXAM: PORTABLE CHEST 1 VIEW COMPARISON:  None. FINDINGS: Normal heart size and mediastinal contours. No acute infiltrate or edema. No effusion or pneumothorax. No acute osseous findings. IMPRESSION: Negative chest. Electronically Signed   By: Monte Fantasia M.D.   On: 04/13/2018 06:26   Dg Abd Portable 1v  Result Date: 04/23/2018 CLINICAL DATA:  Orogastric tube placement EXAM: PORTABLE ABDOMEN - 1 VIEW COMPARISON:  None. FINDINGS: The side port of the orogastric tube projects over the gastric body. The tip is excluded view. Visualized right lung base is clear. IMPRESSION: Orogastric tube side port overlying the gastric body. Electronically Signed   By: Ulyses Jarred M.D.   On: 04/12/2018 19:30      HISTORY OF PRESENT ILLNESS (From Dr Cecil Cobbs note 05/08/2018) Devynne Sturdivant is a 28 y.o. female with no significant past medical history who presents with slurred speech that started acutely around 3 PM.  Subsequently around 8 PM she developed diplopia  and left sided numbness. This prompted her to come to the emergency department at [facility] where CT showed cerebellar hypodensity.  She states that she has had left neck pain for the past 3 days, but denies any antecedent injury to her neck or wrenching movements or other potential causes. She denies any previous episodes of vertigo or similar symptoms.   LKW: 3 PM 3/30 tpa given?: no, outside of window Premorbid modified rankin scale: 0 NIHSS: 7(1 for sensation, 2 for ataxia, 1 for facial weakness, 2 for gaze deviation)  HOSPITAL COURSE Ms. Parish Augustine is a 28 y.o. female with no significant PMH presenting to [facility] with acute onset slurred speech followed by diplopia and L sided numbness.     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 However presence of old cerebellar infarcts raises possibility of occlusive posterior circulation disease with acute on chronic occlusion. S/p IR w/ TICI3 revascularization BA w/ stent placement. 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  Taken to IR post neuro worsening. Post BA stent w/ initial improvement but then neuro worsening. Intubated. MRI shows increased stroke. 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   CT head patchy B cerebellar L>R hypodensity  MRI head multifocial acute, subacute and chornic posterior circulation infarcts. Attenuated L VA and BA w/ slow flow, occlusion  MRI CS L VA slow flow vs occlusion, o/w neg  CTA head occluded L and distal R VA and VBJ. Occluded BA w/ reconstitution basilar tip and complete COW.  CTA neck occluded L VA. Patent R VA. Normal ICAs  Cerebral angio TICI3 revascularization BA w/ stent placement following admission  Post IR CT no ICH or mass effect. No hydrocephalus. Ventricles symmetric.   CT head unchanged B  cerebellar infarcts of mixed ages  Repeat MRI interval evolution of a multifocal posterior cerebral circulation infarcts involving bilateral cerebellar hemispheres as well as pons which appear to have increased compared with previous MRI from day prior  Repeat MRA head shows normal flow in both distal vertebral arteries as well as basilar arteries suggesting likely occlusion of her basilar stent.  2D Echo bubble EF 60-65%. No source of embolus. Neg bubble  LDL 179  HgbA1c 5.1  SCDs for VTE prophylaxis. Add Heparin 5000 units sq tid   No antithrombotic prior to admission, now on aspirin 81 mg daily and Brilinta (ticagrelor) 90 mg bid.continue Brilinta x 30 days along with aspirin at discharge then change to plavix and aspirin.  Brother reaching out to the [employer] from [employer]  Has travelled today to  see her. The RN will notify us once this occurs and letter needed can be provided.  Respiratory Failure  Compromised airway following IR and BA stent   Intubated on 4N  Sedated   Will need early trach if family wants aggressive care - mother awaiting brother's arrival  CCM onboard  Blood Pressure  Treated post IR w/ cleviprex drip, now off  BP stable 130-140s  No hx HTN  Home meds: none  Dysphagia  Secondary to stroke  NPO  On  TF  Will need early PEG if family wants aggressive care - mother awaiting brother's arrival  Hyperlipidemia  Home meds:  No statin  LDL 179, goal < 70  Added statin - lipitor 80  Continue statin at discharge  Other Stroke Risk Factors  ETOH use, advised to drink no more than 1 drink(s) a day  Overweight  Body mass index is 29.5 kg/m., recommend weight loss, diet and exercise as appropriate   Other Active Problems  Anxiety/depression, OCD on zoloft and rexulti  CXR neg    Per Dr Clydene Fake note 05/10/2018 I met the patient's mother and brother in person upon their request and explained her neurological presentation,  treatment and prognosis and answered questions. They understand that she is locked in state and they have spoken to the patient who has expressed to them her desire not to have prolonged ventilatory support, tracheostomy, PEG tube and nursing home care. They are still struggling with the decision but agree to DO NOT RESUSCITATE and would like to meet with palliative care team's to make her final decision soon. Palliative care team has been consulted.  Per Palliative Care notes: Comfort care with possible one way extubation. SUMMARY OF RECOMMENDATIONS   DNR DNI (patient was made DNR by Dr. Leonie Man on 05/10/2018 per family meeting) We will extubate once family arrives. Family arrived at 2pm and pt successfully extubated without distress.  Per Respiratory Therapy note: 3:05 PM 06/07/2018 Pt extubated per MD order.  PT placed on 2l [place]. Pt is a DNR/DNI with comfort care only.  RN and family at bedside. RT will continue to monitor.   The patient expired at 6:31 PM.  30 minutes were spent preparing discharge.  Mikey Bussing PA-C [facility] Pager [phone] 05/12/2018, 7:40 AM

## 2018-06-07 NOTE — Progress Notes (Signed)
STROKE TEAM PROGRESS NOTE   INTERVAL HISTORY Patient remains neurologically unchanged. She is still able to communicate with vertical eye movements and opening and closing eyelids only. She remains quadriplegic. Vital signs appear stable. Family still deciding about trach and peg.  No changes overnight no acute events.  Vitals:   May 24, 2018 0600 2018/05/24 0700 05-24-18 0800 05/24/18 0900  BP: 132/86 (!) 131/93 137/89 138/87  Pulse: 66 65 65 63  Resp: _0 Temp:   100.3 F (37.9 C)   TempSrc:   Axillary   SpO2: 99% 99% 99% 99%  Weight:      Height:        CBC:  Recent Labs  Lab 04/21/2018 2052  05/08/18 0502  05/10/18 0255 2018/05/24 0252  WBC 9.2  --  12.6*   < > 10.4 10.9*  NEUTROABS 5.9  --  10.5*  --   --   --   HGB 14.5   < > 12.9   < > 12.3 13.0  HCT 45.2   < > 41.1   < > 39.2 41.4  MCV 93.2  --  93.4   < > 94.7 94.5  PLT 231  --  217   < > 166 177   < > = values in this interval not displayed.    Basic Metabolic Panel:  Recent Labs  Lab 05/08/18 0502 05/10/18 0255 2018-05-24 0252  NA 137 140 141  K 3.5 3.7 3.7  CL 106 104 106  CO2 _1 GLUCOSE 117* 131* 131*  BUN <5* 14 15  CREATININE 0.62 0.53 0.59  CALCIUM 8.4* 8.2* 8.7*  MG 1.6* 1.8  --    Lipid Panel:     Component Value Date/Time   CHOL 273 (H) 04/16/2018 0449   CHOL 227 (H) 02/25/2018 1047   TRIG 104 04/23/2018 2200   HDL 76 04/08/2018 0449   HDL 64 02/25/2018 1047   CHOLHDL 3.6 05/03/2018 0449   VLDL 18 04/17/2018 0449   LDLCALC 179 (H) 04/12/2018 0449   LDLCALC 147 (H) 02/25/2018 1047   HgbA1c:  Lab Results  Component Value Date   HGBA1C 5.1 05/04/2018   Urine Drug Screen:     Component Value Date/Time   LABOPIA NONE DETECTED 04/15/2018 1800   COCAINSCRNUR NONE DETECTED 04/28/2018 1800   LABBENZ NONE DETECTED 05/02/2018 1800   AMPHETMU NONE DETECTED 04/23/2018 1800   THCU NONE DETECTED 04/25/2018 1800   LABBARB NONE DETECTED 04/19/2018 1800    Alcohol Level No results  found for: ETH  IMAGING No results found. Cerebral angiogram 04/20/2018 S/P Rt vert arteriogram followed by complete revascularization of  Entire basilar  Artery with x 2 passes with 31m x 3657membotrap retrieverdevice and x 1 pass with 57m56m 40 mm solitaire FR retriever device achieving a TICI 3 revascularization and placement od enterprise stent atsite of flow limiting stenosis in the prox bailar artery with a TICI 3 revascularization  2D Echocardiogram   1. The left ventricle has normal systolic function with an ejection fraction of 60-65%. The cavity size was normal. Left ventricular diastolic parameters were normal.  2. The right ventricle has normal systolic function. The cavity was normal. There is no increase in right ventricular wall thickness.  3. Agitated saline exam indeterminate due to image quality, however no large right to left shunt seen.  4. No intracardiac mass or thrombi identified. No intracardiac source of emboli identified.   PHYSICAL EXAM: Young Caucasian lady  was intubated and not sedated . Afebrile. Head is nontraumatic. Neck is supple without bruit.    Cardiac exam no murmur or gallop. Lungs are clear to auscultation. Distal pulses are well felt. Neurological Exam :  Patient is  Awake with eyes open. She will follow commands only with vertical eye movements and opening and closing her eyes. 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 She is unable to protrude her tongue. She has very weak cough and gag. She has quadriplegia and is unable to move her hands and feet voluntarily. She is does withdrawal semi-purposefully briskly in the lower extremities and minimally in both upper extremities. Plantars downgoing. Gait not tested.   ASSESSMENT/PLAN Ms. Paiden Caraveo is a 28 y.o. female with no significant PMH presenting to [facility] with acute onset slurred speech followed by diplopia and L sided numbness.      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 However presence of old cerebellar infarcts raises possibility of occlusive posterior circulation disease with acute on chronic occlusion. S/p IR w/ TICI3 revascularization BA w/ stent placement. 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 Post BA stent w/ initial improvement but then neuro worsening. Intubated. MRI shows increased stroke. 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   CT head patchy B cerebellar L>R hypodensity  MRI head multifocial acute, subacute and chornic posterior circulation infarcts. Attenuated L VA and BA w/ slow flow, occlusion  MRI CS L VA slow flow vs occlusion, o/w neg  CTA head occluded L and distal R VA and VBJ. Occluded BA w/ reconstitution basilar tip and complete COW.  CTA neck occluded L VA. Patent R VA. Normal ICAs  Cerebral angio TICI3 revascularization BA w/ stent placement following admission  Post IR CT no ICH or mass effect. No hydrocephalus. Ventricles symmetric.   CT head unchanged B cerebellar infarcts of mixed ages  Repeat MRI interval evolution of a multifocal posterior cerebral circulation infarcts involving bilateral cerebellar hemispheres as well as pons which appear to have increased compared with previous MRI from day prior  Repeat MRA head shows normal flow in both distal vertebral arteries as well as basilar arteries suggesting likely occlusion of her basilar stent.  2D Echo bubble EF 60-65%. No source of embolus. Neg bubble  LDL 179  HgbA1c 5.1  SCDs for VTE prophylaxis. Add Heparin 5000 units sq tid   No antithrombotic prior to admission, now on aspirin 81 mg daily and Brilinta (ticagrelor) 90 mg bid.continue Brilinta x 30 days along with aspirin at discharge then change to plavix and  aspirin.  Therapy recommendations:  pending   Disposition:  pending .   Brother reaching out to the [employer] from [employer]  Has travelled today to  see her. The RN will notify us once this occurs and letter needed can be provided.  Respiratory Failure  Compromised airway following IR and BA stent   Intubated on 4N  Sedated   Will need early trach if family wants aggressive care - mother awaiting brother's arrival  CCM onboard  Blood Pressure  Treated post IR w/ cleviprex drip, now off  BP stable 130-140s  No hx HTN  Home meds: none  Dysphagia  Secondary to stroke  NPO  On  TF  Will need early PEG if family wants aggressive care - mother awaiting brother's arrival  Hyperlipidemia  Home meds:  No statin  LDL 179, goal < 70  Added statin - lipitor 80  Continue statin at discharge  Other Stroke Risk Factors  ETOH use, advised to drink no more than 1 drink(s) a day  Overweight  Body mass index is 29.5 kg/m., recommend weight loss, diet and exercise as appropriate   Other Active Problems  Anxiety/depression, OCD on zoloft and rexulti  CXR neg  Per Dr Clydene Fake note 05/10/2018 I met the patient's mother and brother in person upon their request and explained her neurological presentation, treatment and prognosis and answered questions. They understand that she is locked in state and they have spoken to the patient who has expressed to them her desire not to have prolonged ventilatory support, tracheostomy, PEG tube and nursing home care. They are still struggling with the decision but agree to DO NOT RESUSCITATE and would like to meet with palliative care team's to make her final decision soon. Palliative care team has been consulted.  Per Palliative Care note - Comfort care with possible one way extubation.  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  Very difficult decision at this point for the family.  Dr. said he  did meet with the family yesterday and they are considering trach and PEG versus comfort care with one-way extubation.  Extremely sad situation. Personally examined patient and images, and have participated in and made any corrections needed to history, physical, neuro exam,assessment and plan as stated above.  I have personally obtained the history, evaluated lab date, reviewed imaging studies and agree with radiology interpretations.    Sarina Ill, MD Stroke Neurology   A total of 25 minutes was spent for the care of this patient, spent on counseling patient and family on different diagnostic and therapeutic options, counseling and coordination of care, riskd ans benefits of management, compliance, or risk factor reduction and education.             To contact Stroke Continuity provider, please refer to [email or web address] After hours, contact General Neurology

## 2018-06-07 NOTE — Consult Note (Signed)
Consultation Note Date: 2018-06-06   Patient Name: Erika Hernandez  DOB: 03-05-1990  MRN: [id number]  Age / Sex: 28 y.o., female  PCP: Patient, No Pcp Per Referring Physician: Garvin Fila, MD  Reason for Consultation: Establishing goals of care, Non pain symptom management, Pain control, Psychosocial/spiritual support, Terminal Care and Withdrawal of life-sustaining treatment  HPI/Patient Profile: 28 y.o. female  with past medical history of OCD, depression, anxiety admitted on 04/28/2018 with slurred speech, diplopia, left-sided numbness.  Patient has no previous neurological history.  She had been complaining of neck pain for 3 days prior to the above symptoms.  Consult ordered for goals of care as well as discussion of withdrawal of ventilator.  .   Clinical Assessment and Goals of Care: Patient has been able to communicate by opening her eyes and looking up for yes, closing her eyes looking down for no.  Per chart review, as well as during my assessment, she has been consistently able to do this.  Patient's mother and brother met with Dr. Leonie Man on 05/10/2018.  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  This is a very sad situation, patient had just lost her father in August.  When patient's mother asked her on 05/10/2018 (per nursing who is at the bedside), "do you want to go be with your father?",  Patient moved her eyes up for yes.  RN, Erika Hernandez and myself in to see patient this morning to talk about her wishes.  She reiterates by appropriate eye movement that she does not want to live on life support and wishes to have the tube removed today.  Did ask several different ways and she remain consistent in her responses to [place] as well  I spoke to patient's mother who is be arriving to the unit this afternoon for withdrawal of ventilator    Patient is still able to speak  for herself to a limited degree with the usage of her eyes.  Her healthcare proxy is her mother, Erika Hernandez at [phone]  Upon arrival to unit , mother asked daughter again if she would like to continue with aggressive treatment or "go be with Jesus". Pt clearly indicated yes by upward eye movement  Prepared family that death could be minutes after extubation or longer. Explained process as well as rationale and utilization of meds to liberate Erika Hernandez from the vent, with goal to provide comfort.  Chaplain notified    SUMMARY OF RECOMMENDATIONS   DNR DNI (patient was made DNR by Dr. Leonie Man on 05/10/2018 per family meeting this ( We will extubate once family arrives. Family arrived at 2pm and pt successfully extubated without distress For medicines in place see below Code Status/Advance Care Planning:  DNR    Symptom Management:   Dyspnea: We will start morphine continuous infusion at 1 mg an hour with up titration parameters to 10 mg an hour.  Will monitor and titrate for effect  Pain: Patient has  not been complaining of pain but morphine order should be sufficient  Secretions: We will start scheduled Robinul to ensure against excess secretions once extubated; Robinul 0.4 mg every 6 hours  Anxiety: Versed gtt 1/hr and 2-4 q 20 min prn  Palliative Prophylaxis:   Aspiration, Bowel Regimen, Delirium Protocol, Eye Care, Frequent Pain Assessment, Oral Care and Turn Reposition  Additional Recommendations (Limitations, Scope, Preferences):  Full Comfort Care  Psycho-social/Spiritual:   Desire for further Chaplaincy support:no  Additional Recommendations: Referral to Community Resources   Prognosis:   Hours - Days  Discharge Planning: Anticipated Hospital Death      Primary Diagnoses: Present on Admission: . Stroke (cerebrum) ([facility])   I have reviewed the medical record, interviewed the patient and family, and examined the patient. The  following aspects are pertinent.  Past Medical History:  Diagnosis Date  . Asthma   . GAD (generalized anxiety disorder)   . Kidney stone   . Major depressive disorder   . Mixed obsessional thoughts and acts   . OCD (obsessive compulsive disorder)    Social History   Socioeconomic History  . Marital status: Single    Spouse name: Not on file  . Number of children: Not on file  . Years of education: Not on file  . Highest education level: Not on file  Occupational History  . Not on file  Social Needs  . Financial resource strain: Not on file  . Food insecurity:    Worry: Not on file    Inability: Not on file  . Transportation needs:    Medical: Not on file    Non-medical: Not on file  Tobacco Use  . Smoking status: Never Smoker  . Smokeless tobacco: Never Used  Substance and Sexual Activity  . Alcohol use: Yes    Alcohol/week: 1.0 standard drinks    Types: 1 Cans of beer per week    Comment: 1-2 per week  . Drug use: No  . Sexual activity: Not Currently  Lifestyle  . Physical activity:    Days per week: Not on file    Minutes per session: Not on file  . Stress: Not on file  Relationships  . Social connections:    Talks on phone: Not on file    Gets together: Not on file    Attends religious service: Not on file    Active member of club or organization: Not on file    Attends meetings of clubs or organizations: Not on file    Relationship status: Not on file  Other Topics Concern  . Not on file  Social History Narrative  . Not on file   Family History  Problem Relation Age of Onset  . Kidney Stones Father    Scheduled Meds: . glycopyrrolate  0.4 mg Intravenous Q6H  . mouth rinse  15 mL Mouth Rinse 10 times per day   Continuous Infusions: . sodium chloride 20 mL/hr at May 23, 2018 0700  . midazolam    . morphine     PRN Meds:.acetaminophen **OR** acetaminophen (TYLENOL) oral liquid 160 mg/5 mL **OR** acetaminophen, fentaNYL (SUBLIMAZE) injection,  midazolam, morphine, ondansetron (ZOFRAN) IV Medications Prior to Admission:  Prior to Admission medications   Medication Sig Start Date End Date Taking? Authorizing Provider  Brexpiprazole (REXULTI) 2 MG TABS Take 2 mg by mouth every morning. 03/19/18  Yes Hurst, Teresa T, PA-C  sertraline (ZOLOFT) 100 MG tablet Take 3 tablets (300 mg total) by mouth daily. 03/19/18  Yes Donnal Moat  T, PA-C   No Known Allergies Review of Systems  Unable to perform ROS: Intubated    Physical Exam Vitals signs reviewed.  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  She opens her eyes to voice, intubated.  Communicates by closing her eyes for no opening her eyes and looking up for yes  Cardiovascular:     Rate and Rhythm: Normal rate.  Genitourinary:    Comments: Foley Skin:    General: Skin is warm and dry.  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  Movement noted with her eyes as a form of communication: Eye movement up for yes closed for no. Did observe her stretching her legs this morning She does not maintain wakefulness for any length of time, and usually drifts back to sleep within a matter of 15 seconds  Psychiatric:     Comments: Unable to test     Vital Signs: BP (!) 143/88   Pulse 63   Temp 100.3 F (37.9 C) (Axillary)   Resp 14   Ht '5\' 8"'  (1.727 m)   Wt 88 kg   LMP 05/01/2018 (Exact Date)   SpO2 99%   BMI 29.50 kg/m  Pain Scale: 0-10 POSS *See Group Information*: S-Acceptable,Sleep, easy to arouse Pain Score: 0-No pain   SpO2: SpO2: 99 % O2 Device:SpO2: 99 % O2 Flow Rate: .O2 Flow Rate (L/min): 10 L/min  IO: Intake/output summary:   Intake/Output Summary (Last 24 hours) at 06/09/2018 1245 Last data filed at 2018/06/09 1200 Gross per 24 hour  Intake 2184.22 ml  Output 1575 ml  Net 609.22 ml    LBM: Last BM Date: (pta) Baseline Weight: Weight: 83.9 kg Most recent weight: Weight: 88 kg     Palliative  Assessment/Data:   Flowsheet Rows     Most Recent Value  Intake Tab  Referral Department  Neurology  Unit at Time of Referral  ICU  Palliative Care Primary Diagnosis  Neurology  Date Notified  05/10/18  Palliative Care Type  New Palliative care  Reason for referral  Psychosocial or Spiritual support, Clarify Goals of Care, End of Life Care Assistance  Date of Admission  05/05/2018  Date first seen by Palliative Care  06-09-2018  # of days Palliative referral response time  1 Day(s)  # of days IP prior to Palliative referral  4  Clinical Assessment  Palliative Performance Scale Score  20%  Pain Max last 24 hours  Not able to report  Pain Min Last 24 hours  Not able to report  Dyspnea Max Last 24 Hours  Not able to report  Dyspnea Min Last 24 hours  Not able to report  Nausea Max Last 24 Hours  Not able to report  Nausea Min Last 24 Hours  Not able to report  Anxiety Max Last 24 Hours  Not able to report  Anxiety Min Last 24 Hours  Not able to report  Other Max Last 24 Hours  Not able to report  Psychosocial & Spiritual Assessment  Palliative Care Outcomes  Patient/Family meeting held?  Yes  Who was at the meeting?  pt, motehr and brother  Palliative Care Outcomes  Provided psychosocial or spiritual support, Provided end of life care assistance, Completed durable DNR, Clarified goals of care, Changed to focus on comfort  Patient/Family wishes: Interventions discontinued/not started   Mechanical Ventilation, BiPAP, Hemodialysis, NIPPV, Tube feedings/TPN, Vasopressors, Transfusion, Antibiotics, Trach, PEG  Palliative Care follow-up planned  Yes, Facility      Time In: 1300 Time Out: 1510 Time Total: 130 min Greater than 50%  of this time was spent counseling and coordinating care related to the above assessment and plan.  Signed by: Dory Horn, NP   Please contact Palliative Medicine Team phone at [phone] for questions and concerns.  For individual provider: See Shea Evans

## 2018-06-07 DEATH — deceased

## 2019-07-20 IMAGING — DX PORTABLE CHEST - 1 VIEW
1 series · 1 of 1 positions shown · non-contrast
Comparison: Portable exam 8040 hours compared to 05/07/2018 at 7387
hours

CLINICAL DATA: Intubation

EXAM:
PORTABLE CHEST 1 VIEW

[chest ap]
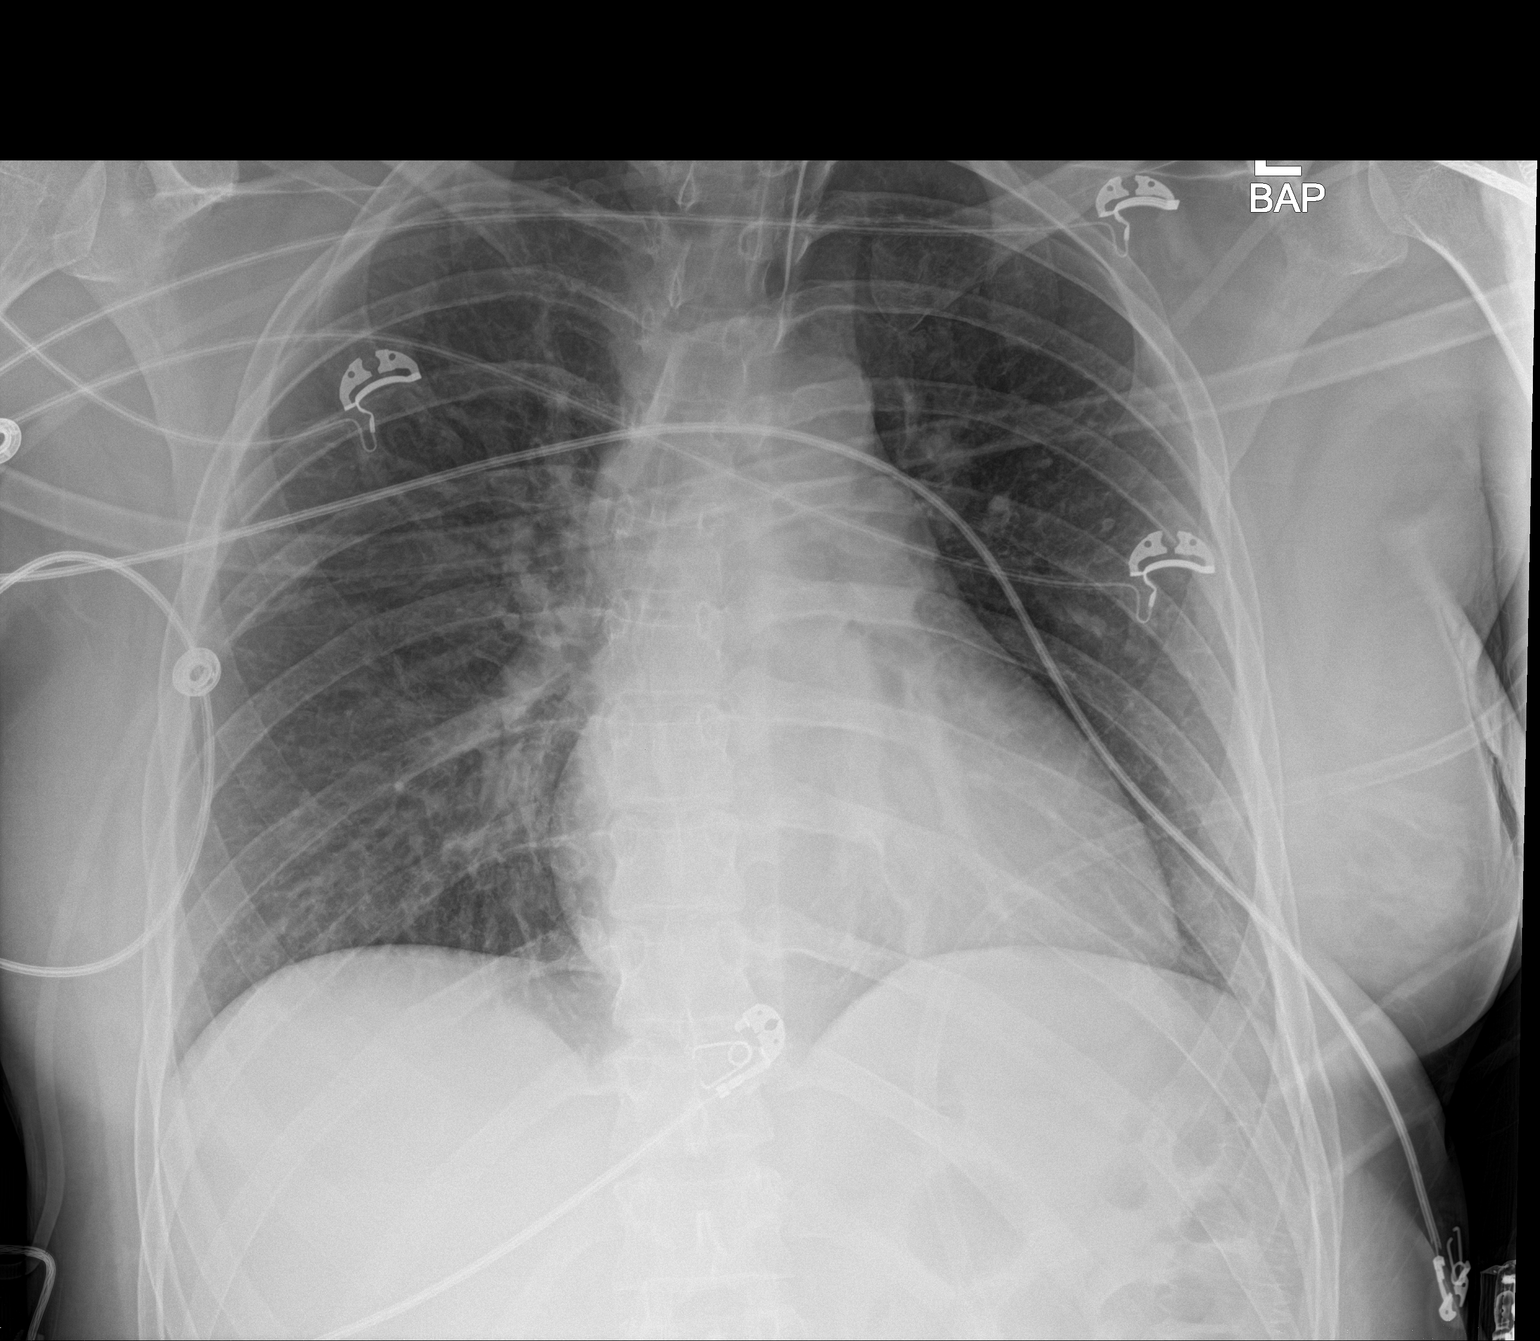

[1 of 1 positions shown; findings below may reference images not displayed]

FINDINGS: Tip of endotracheal tube projects 4.0 cm above carina.

Normal heart size, mediastinal contours, and pulmonary vascularity.

Lungs clear.

No definite infiltrate, pleural effusion or pneumothorax.
IMPRESSION: No acute abnormalities.

## 2019-07-20 IMAGING — CT CT ANGIOGRAPHY NECK
1 of 8 series · 6 of 33 positions shown · IV contrast (iopamidol)
Comparison: CT HEAD May 06, 2018 and MRI head May 07, 2018.

CLINICAL DATA: LEFT-sided numbness this and neck pain. Follow-up
infarcts.

EXAM:
CT ANGIOGRAPHY HEAD AND NECK
TECHNIQUE: Multidetector CT imaging of the head and neck was performed using
the standard protocol during bolus administration of intravenous
contrast. Multiplanar CT image reconstructions and MIPs were
obtained to evaluate the vascular anatomy. Carotid stenosis
measurements (when applicable) are obtained utilizing NASCET
criteria, using the distal internal carotid diameter as the
denominator.
CONTRAST:  75 cc [id number]-FM2 IOPAMIDOL ([id number]-[id number]) INJECTION 76%

[Series 7: cta neck axial · axial · 0.39mm/px · z∈[+1124,+1372]mm · 6 of 358 slices shown]
[im 52/358  soft-tissue]
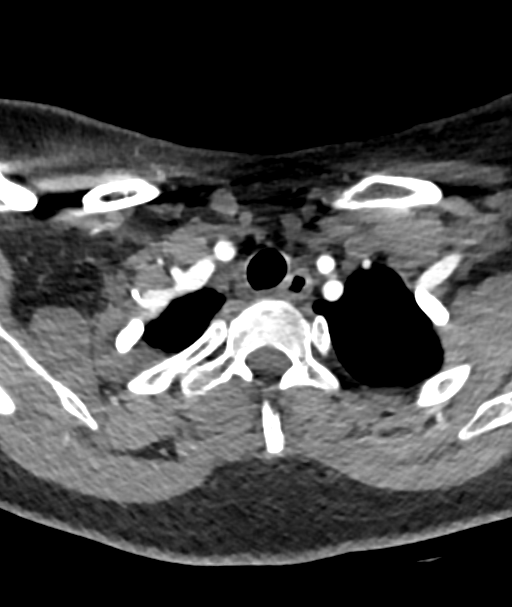
[im 103/358  bone]
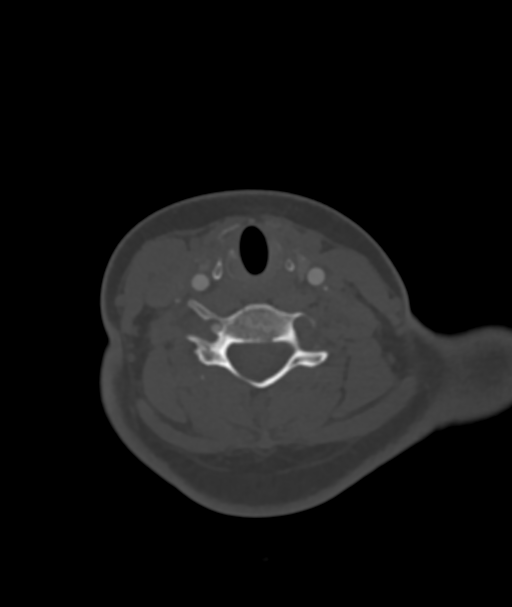
[im 154/358  soft-tissue]
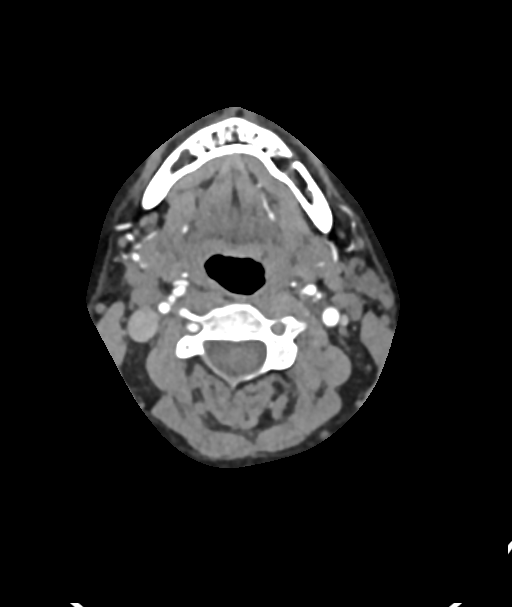
[im 205/358  bone]
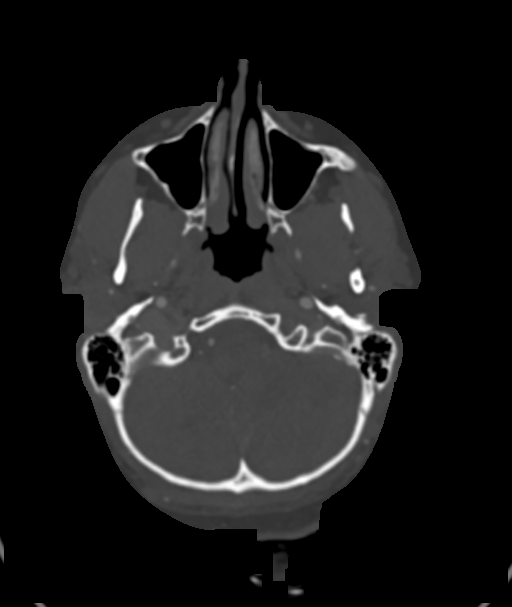
[im 256/358  soft-tissue]
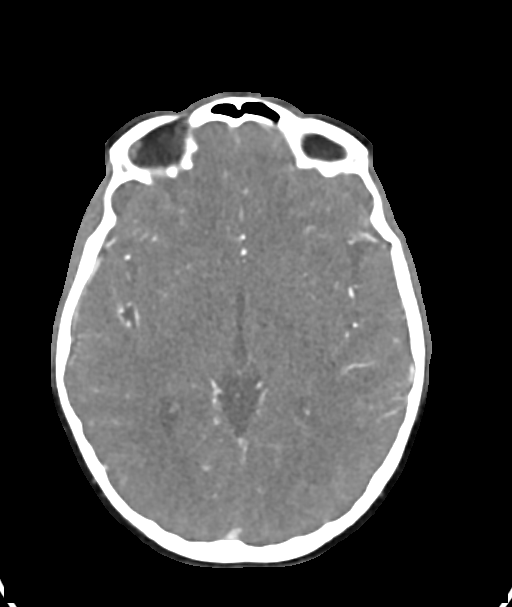
[im 307/358  bone]
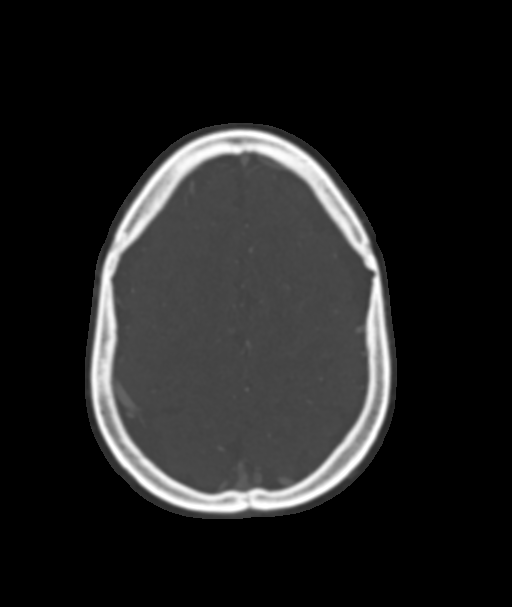

[6 of 33 positions shown; findings below may reference images not displayed]

FINDINGS: CTA NECK FINDINGS:

AORTIC ARCH: Normal appearance of the thoracic arch, normal branch
pattern. The origins of the innominate, left Common carotid artery
and subclavian artery are patent.

RIGHT CAROTID SYSTEM: Common carotid artery is patent. Normal
appearance of the carotid bifurcation without hemodynamically
significant stenosis by NASCET criteria. Normal appearance of the
internal carotid artery.

LEFT CAROTID SYSTEM: Common carotid artery is patent. Normal
appearance of the carotid bifurcation without hemodynamically
significant stenosis by NASCET criteria. Normal appearance of the
internal carotid artery.

VERTEBRAL ARTERIES:LEFT vertebral artery occluded from origin
without reconstitution in the neck. Normal RIGHT vertebral artery.

SKELETON: No acute osseous process though bone windows have not been
submitted.

OTHER NECK: Soft tissues of the neck are nonacute though, not
tailored for evaluation.

UPPER CHEST: Mild heterogeneous lung attenuation seen with small
airway disease.

CTA HEAD FINDINGS:

ANTERIOR CIRCULATION: Patent cervical internal carotid arteries,
petrous, cavernous and supra clinoid internal carotid arteries.
Patent anterior communicating artery. Patent anterior and middle
cerebral arteries.

No large vessel occlusion, flow-limiting stenosis, contrast
extravasation or aneurysm.

POSTERIOR CIRCULATION: Occluded LEFT vertebral artery without
reconstitution. Occluded distal RIGHT vertebral artery. Occluded
vertebrobasilar junction and basilar artery with collateral vessel
within interpeduncular notch. Reconstitution of basilar artery at
tip. Robust posterior communicating arteries present.

No large vessel occlusion, flow-limiting stenosis, contrast
extravasation or aneurysm.

VENOUS SINUSES: Major dural venous sinuses are patent though not
tailored for evaluation on this angiographic examination.

ANATOMIC VARIANTS: None.

DELAYED PHASE: Similar nodular enhancement LEFT > RIGHT occipital
lobes corresponding to subacute infarcts.

MIP images reviewed.
IMPRESSION: CTA NECK:

1. Occluded LEFT vertebral artery without reconstitution. Patent
RIGHT vertebral artery.
2. Normal bilateral ICA's.

CTA HEAD:

1. Occluded LEFT and distal RIGHT vertebral arteries and
vertebrobasilar junction. Occluded basilar artery with basilar tip
reconstitution via collateral vessels and complete circle-of-Willis.

Acute findings discussed with and reconfirmed by Dr.Kizer,
Neurology on 05/07/2018 at [DATE].

## 2019-07-20 IMAGING — MR MRI CERVICAL SPINE WITHOUT AND WITH CONTRAST
21 of 23 series · 41 of 48 positions shown · IV contrast (Gadavist)
Comparison: CT HEAD May 06, 2018

CLINICAL DATA: LEFT neck pain for a few days. LEFT arm numbness
beginning this afternoon and transient slurred speech.

EXAM:
MRI HEAD WITHOUT AND WITH CONTRAST
MRI CERVICAL SPINE WITHOUT AND WITH CONTRAST
TECHNIQUE: Multiplanar, multiecho pulse sequences of the brain and surrounding
structures, and cervical spine, to include the craniocervical
junction and cervicothoracic junction, were obtained without and
with intravenous contrast.
CONTRAST:  8 cc Gadavist

[Series 5: DWI · axial · 4.0mm · 0.88mm/px · z∈[-61,+76]mm · 5 of 72 slices shown (1 of 4)]
[im 1/72]
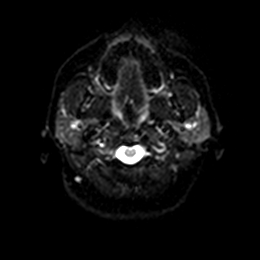
[im 18/72]
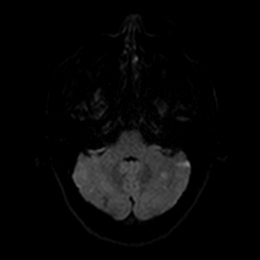
[im 36/72]
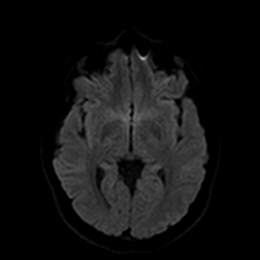
[im 54/72]
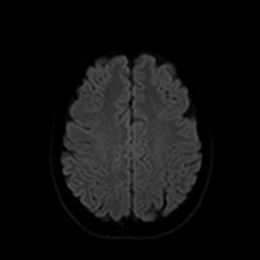
[im 72/72]
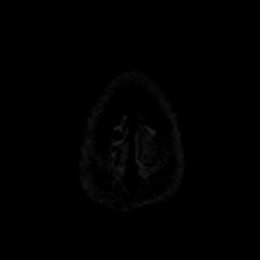

[Series 6: DWI · axial · 4.0mm · 0.88mm/px · z∈[-61,+76]mm · 2 of 36 slices shown (2 of 4)]
[im 1/36]
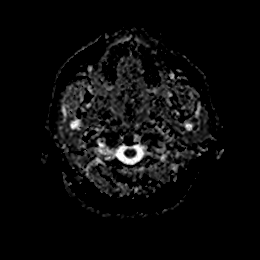
[im 36/36]
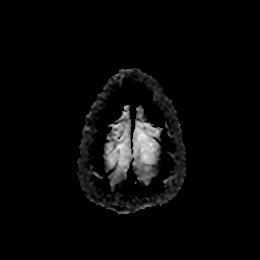

[Series 7: DWI · coronal · 4.0mm · 0.88mm/px · 4 of 68 slices shown (3 of 4)]
[im 1/68]
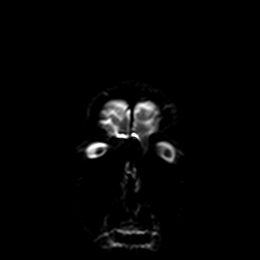
[im 23/68]
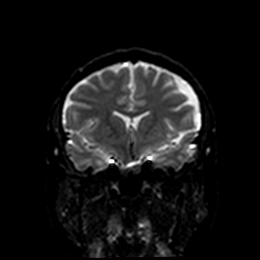
[im 45/68]
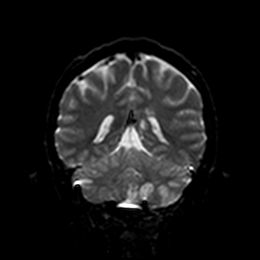
[im 68/68]
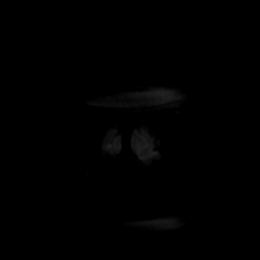

[Series 8: DWI · coronal · 4.0mm · 0.88mm/px · 2 of 34 slices shown (4 of 4)]
[im 1/34]
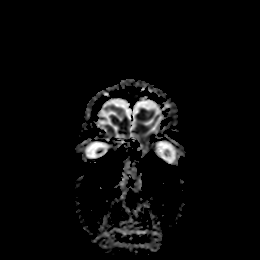
[im 34/34]
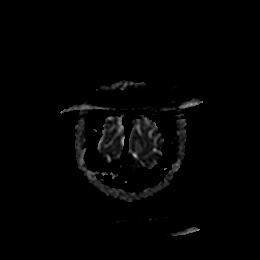

[Series 9: T1 · sagittal · 5.0mm · 0.75mm/px · 1 of 24 slices shown (1 of 3)]
[im 1/24]
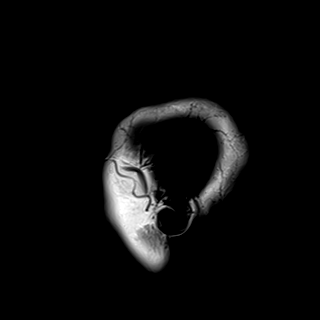

[Series 10: T2 · axial · 5.0mm · 0.72mm/px · 1 of 25 slices shown (1 of 3)]
[im 1/25]
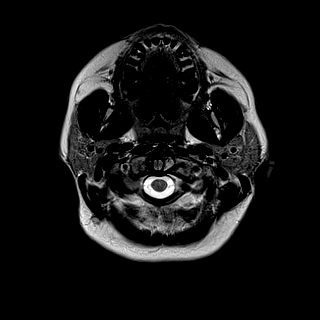

[Series 11: FLAIR · axial · 5.0mm · 0.45mm/px · 1 of 25 slices shown]
[im 1/25]
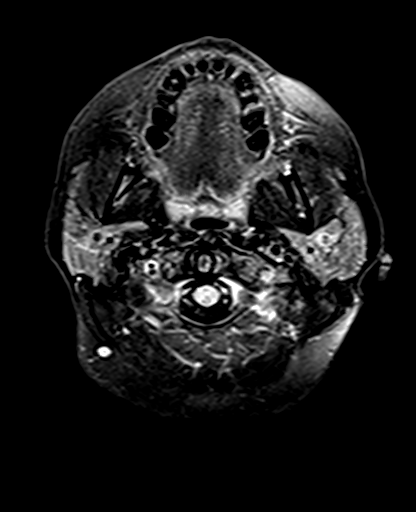

[Series 12: mag_images · axial · 3.0mm · 0.90mm/px · z∈[-75,+99]mm · 3 of 60 slices shown]
[im 1/60]
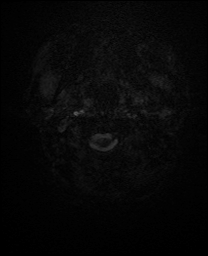
[im 30/60]
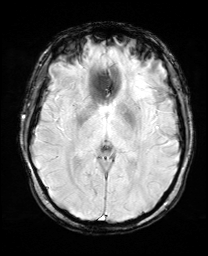
[im 60/60]
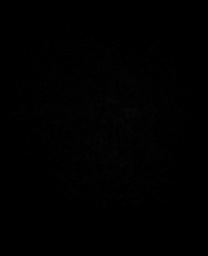

[Series 13: pha_images · axial · 3.0mm · 0.90mm/px · z∈[-75,+90]mm · 3 of 57 slices shown]
[im 1/57]
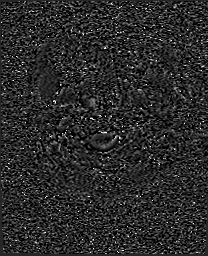
[im 29/57]
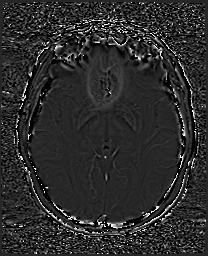
[im 57/57]
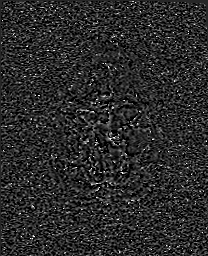

[Series 14: swi_images · axial · 3.0mm · 0.90mm/px · z∈[-75,+99]mm · 3 of 60 slices shown]
[im 1/60]
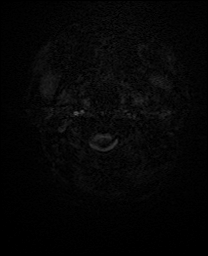
[im 30/60]
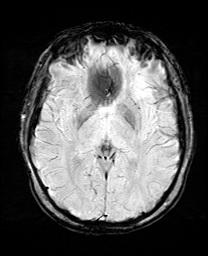
[im 60/60]
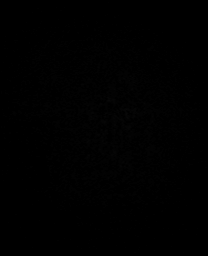

[Series 15: mip_images(sw) · axial · 24.0mm · 0.90mm/px · z∈[-64,+89]mm · 3 of 53 slices shown]
[im 1/53]
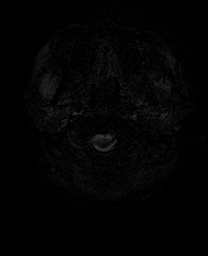
[im 27/53]
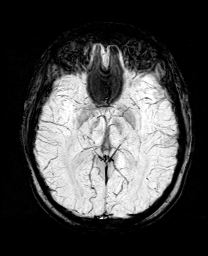
[im 53/53]
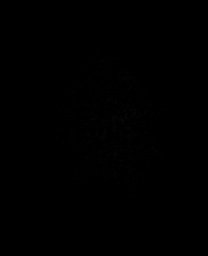

[Series 17: T2 post-contrast · coronal · 5.0mm · 0.72mm/px · 1 of 28 slices shown]
[im 1/28]
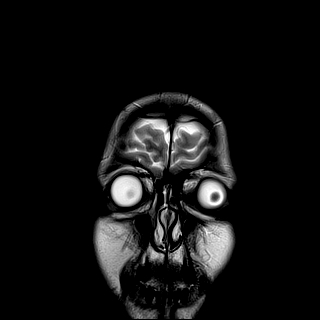

[Series 22: T2 · sagittal · 3.0mm · 0.69mm/px · 1 of 15 slices shown (2 of 3)]
[im 1/15]
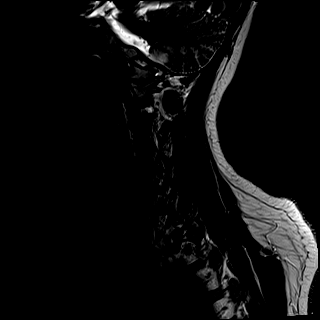

[Series 23: T1 · sagittal · 3.0mm · 0.69mm/px · 1 of 15 slices shown (2 of 3)]
[im 1/15]
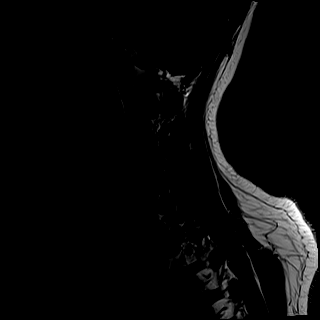

[Series 24: STIR · sagittal · 3.0mm · 0.86mm/px · 1 of 15 slices shown]
[im 1/15]
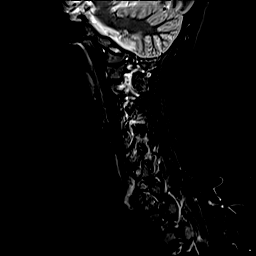

[Series 25: T2 · axial · 3.0mm · 0.66mm/px · z∈[-183,-69]mm · 2 of 37 slices shown (3 of 3)]
[im 1/37]
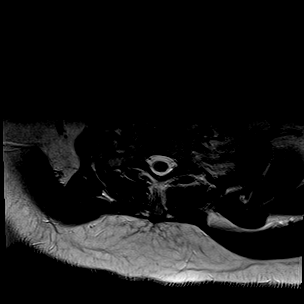
[im 37/37]
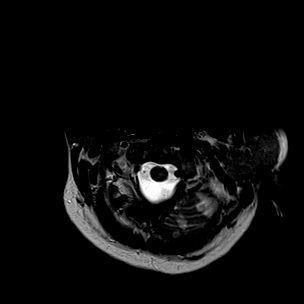

[Series 26: GRE · axial · 3.0mm · 0.39mm/px · 1 of 37 slices shown]
[im 1/37]
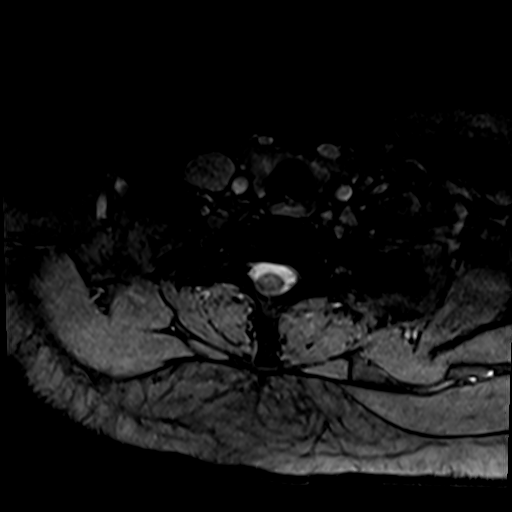

[Series 27: T1 · axial · 3.0mm · 0.39mm/px · z∈[-183,-69]mm · 2 of 37 slices shown (3 of 3)]
[im 1/37]
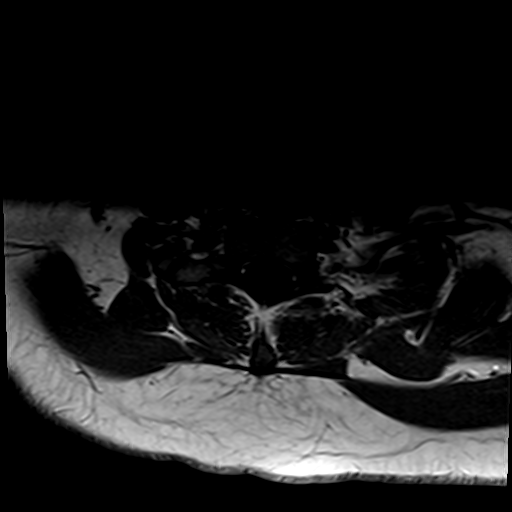
[im 37/37]
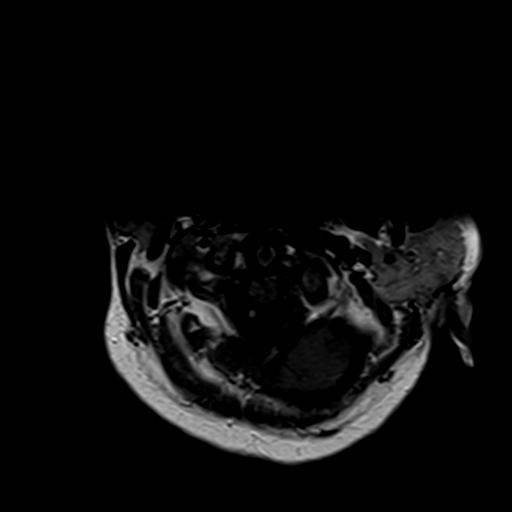

[Series 28: T1 fat-sat post-contrast · sagittal · 3.0mm · 0.43mm/px · 1 of 15 slices shown]
[im 1/15]
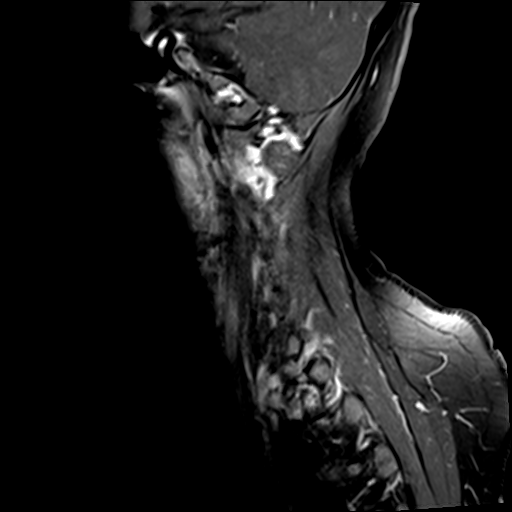

[Series 29: T1 post-contrast · axial · 3.0mm · 0.39mm/px · z∈[-183,-69]mm · 2 of 37 slices shown (1 of 2)]
[im 1/37]
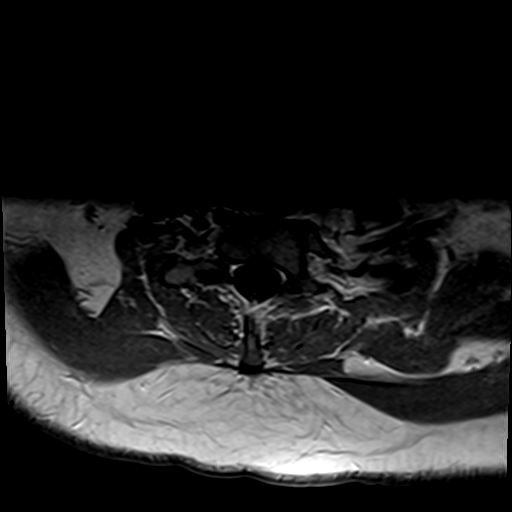
[im 37/37]
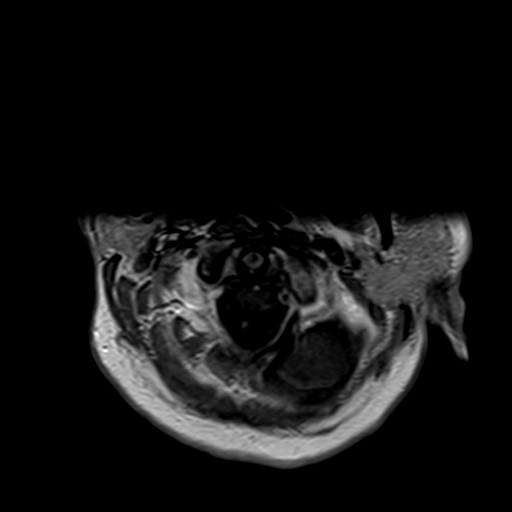

[Series 31: T1 post-contrast · coronal · 5.0mm · 0.34mm/px · 1 of 28 slices shown (2 of 2)]
[im 1/28]
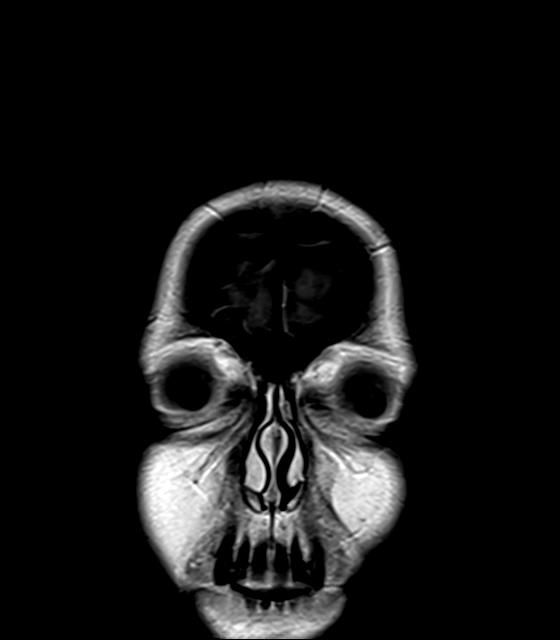

[41 of 48 positions shown; findings below may reference images not displayed]

FINDINGS: MRI HEAD FINDINGS

INTRACRANIAL CONTENTS: Patchy LEFT > RIGHT cerebellar and pontine
reduced diffusion with low ADC values and to lesser extent
normalized ADC values. Nodular enhancement of LEFT > RIGHT occipital
lobes consistent with subacute infarct. Old small RIGHT cerebellar,
LEFT splenium of corpus callosum, LEFT periventricular white matter
and LEFT occipital infarcts. No midline shift, masses or abnormal
extra-axial fluid collections. No parenchymal brain volume loss for
age. No hydrocephalus. No abnormal parenchymal or extra-axial
enhancement though post gadolinium sequences are moderately motion
degraded.

VASCULAR: Slightly attenuated LEFT vertebral artery flow void in the
neck. Attenuated basilar artery flow void.

SKULL AND UPPER CERVICAL SPINE: No abnormal sellar expansion. No
suspicious calvarial bone marrow signal. Craniocervical junction
maintained.

SINUSES/ORBITS: The mastoid air-cells and included paranasal sinuses
are well-aerated.The included ocular globes and orbital contents are
non-suspicious.

OTHER: None.

MRI CERVICAL SPINE FINDINGS

ALIGNMENT: Straightened cervical lordosis.  No malalignment.

VERTEBRAE/DISCS: Vertebral bodies are intact. Intervertebral disc
morphology's and signal are normal. No abnormal or acute bone marrow
signal. No abnormal osseous or disc enhancement.

CORD:Cervical spinal cord is normal morphology and signal
characteristics from the cervicomedullary junction to level of T2-3,
the most caudal well visualized level.

POSTERIOR FOSSA, VERTEBRAL ARTERIES, PARASPINAL TISSUES: No MR
findings of ligamentous injury. Attenuated LEFT vertebral artery
flow void.

DISC LEVELS:

No disc bulge, canal stenosis nor neural foraminal narrowing.
IMPRESSION: MRI head:

1. Multifocal acute, subacute and chronic posterior circulation
nonhemorrhagic infarcts.
2. Attenuated LEFT vertebral artery and basilar artery seen with
slow flow or occlusion. Recommend CTA HEAD and NECK.

MRI cervical spine:

1. Slow flow versus occluded LEFT vertebral artery.
2. Otherwise negative MRI cervical spine with and without contrast.

Acute findings discussed with and reconfirmed by Dr.ASINOBI RONDON on
05/07/2018 at [DATE].
